# Patient Record
Sex: Female | Born: 1990 | Race: White | Hispanic: No | Marital: Single | Smoking: Current every day smoker
Health system: Southern US, Community
[De-identification: ages and names within clinical notes are randomized; demographics above are authoritative.]

## PROBLEM LIST (undated history)

## (undated) DIAGNOSIS — R011 Cardiac murmur, unspecified: Secondary | ICD-10-CM

## (undated) DIAGNOSIS — F419 Anxiety disorder, unspecified: Secondary | ICD-10-CM

## (undated) DIAGNOSIS — K219 Gastro-esophageal reflux disease without esophagitis: Secondary | ICD-10-CM

## (undated) HISTORY — PX: CHOLECYSTECTOMY: SHX55

## (undated) HISTORY — DX: Cardiac murmur, unspecified: R01.1

## (undated) HISTORY — PX: EXTERNAL EAR SURGERY: SHX627

## (undated) HISTORY — DX: Gastro-esophageal reflux disease without esophagitis: K21.9

---
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
## 2010-09-21 NOTE — Op Note (Signed)
[name], [name]          ACCOUNT NO.:  [id number]   MEDICAL RECORD NO.:  [id number]          PATIENT TYPE:  AMB   LOCATION:  DAY                           FACILITY:  [facility]   PHYSICIAN:  Dalia Heading, M.D.  DATE OF BIRTH:  03-01-1991   DATE OF PROCEDURE:  DATE OF DISCHARGE:                               OPERATIVE REPORT   PREOPERATIVE DIAGNOSIS:  Chronic cholecystitis.   POSTOPERATIVE DIAGNOSIS:  Chronic cholecystitis.   PROCEDURE:  Laparoscopic cholecystectomy.   SURGEON:  Dalia Heading, M.D.   ANESTHESIA:  General endotracheal.   INDICATIONS:  The patient is a 20 year old white female who is referred  for evaluation and treatment of biliary colic secondary to chronic  cholecystitis.  The risks and benefits of the procedure including,  bleeding, infection, hepatobiliary injury, the pus and bleeding from an  open procedure were fully explained to the patient, who gave informed  consent.   PROCEDURE NOTE:  The patient was placed in the supine position.  After  induction of general endotracheal anesthesia, the abdomen was prepped  and draped using the usual sterile technique with Betadine.  Surgical  site confirmation was performed.   A supraumbilical incision was made down to the fascia.  A Veress needle  was introduced into the abdominal cavity and confirmation of placement  was done using the saline drop test.  The abdomen was then insufflated  to 16 mmHg pressure.  An 11-mm trocar was introduced into the abdominal  cavity under direct visualization without difficulty.  The patient was  placed in reversed Trendelenburg position.  An additional 11-mm trocar  was placed in the epigastric region and 5-mm trocars were placed in the  right upper quadrant and right flank regions.  Liver was inspected and  noted to be within normal limits.  The gallbladder was retracted  superiorly and laterally.  The dissection was begun around the  infundibulum of the gallbladder.   The cystic duct was first identified.  The juncture to the infundibulum fully identified.  Endoclips were  placed proximally and distally on the cystic duct, and the cystic duct  was divided.  This was likewise done on the cystic artery.  The  gallbladder was then freed away from the gallbladder fossa using Bovie  electrocautery.  The gallbladder was delivered through the epigastric  trocar site using Endocatch bag.  The gallbladder was decompressed prior  to its removal.  The gallbladder fossa was inspected.  No abnormal  bleeding or bile leakage was noted.  Surgicel was placed in the  gallbladder fossa.  All fluid and air were then evacuated from the  abdominal cavity prior to removal of the trocars.   All wounds were irrigated with normal saline.  All wounds were injected  with 0.5% Sensorcaine.  The supraumbilical fascia was reapproximated  using an 0 Vicryl interrupted suture.  All skin incisions were closed  using staples.  Betadine ointment and dry sterile dressings were  applied.   All tape and needle counts were correct at the end of the procedure.  The patient was extubated in the operating room and went back to  recovery room awake in stable condition.   COMPLICATIONS:  None.   SPECIMEN:  Gallbladder.   BLOOD LOSS:  Minimal.      Dalia Heading, M.D.  Electronically Signed     MAJ/MEDQ  D:  10/24/2007  T:  10/25/2007  Job:  [id number]   cc:   Corrie Mckusick, M.D.  Fax: [phone]

## 2010-09-21 NOTE — H&P (Signed)
NAMEGEORGIA, [name]          ACCOUNT NO.:  [id number]   MEDICAL RECORD NO.:  [id number]          PATIENT TYPE:  AMB   LOCATION:  DAY                           FACILITY:  [facility]   PHYSICIAN:  Dalia Heading, M.D.  DATE OF BIRTH:  10/11/1990   DATE OF ADMISSION:  DATE OF DISCHARGE:  LH                              HISTORY & PHYSICAL   CHIEF COMPLAINT:  Chronic cholecystitis.   HISTORY OF PRESENT ILLNESS:  The patient is a 20 year old white female  who is referred for evaluation and treatment of biliary cholic secondary  to chronic cholecystitis.  She has been having intermittent right upper  quadrant abdominal pain with radiation to the flank, nausea, and  indigestion for the past 3 weeks.  It is made worse with fatty foods.  No fever, chills, or jaundice have been noted.   PAST MEDICAL HISTORY:  Includes H. pylori infection, extrinsic  allergies.   PAST SURGICAL HISTORY:  Unremarkable.   CURRENT MEDICATIONS:  Prevpac, Nexium, and allergy pill.   ALLERGIES:  No known drug allergies.   REVIEW OF SYSTEMS:  Noncontributory.   PHYSICAL EXAMINATION:  The patient is an obese white female in no acute  distress.  HEENT:  Examination reveals no scleral icterus.  LUNGS:  Clear to auscultation with equal breath sounds bilaterally.  HEART:  Examination reveals a regular rate and rhythm without S3, S4.  No murmurs.  ABDOMEN:  Soft, nontender, nondistended.  No hepatosplenomegaly, masses,  or hernias are identified.   Ultrasound of the gallbladder is negative.  Hepatobiliary scan reveals  chronic cholecystitis with a low gallbladder ejection fraction and  reproducible symptoms with a fatty meal.   IMPRESSION:  Chronic cholecystitis.   PLAN:  The patient is scheduled for a laparoscopic cholecystectomy on  October 24, 2007.  The risks and benefits of the procedure including  bleeding, infection, hepatobiliary injury, and the possibility of an  open procedure were fully explained to  the patient.  Gave informed  consent.      Dalia Heading, M.D.  Electronically Signed     MAJ/MEDQ  D:  10/23/2007  T:  10/23/2007  Job:  [id number]   cc:   Corrie Mckusick, M.D.  Fax: [phone]

## 2010-11-26 ENCOUNTER — Other Ambulatory Visit (HOSPITAL_COMMUNITY): Payer: Self-pay | Admitting: Family Medicine

## 2010-11-26 ENCOUNTER — Ambulatory Visit (HOSPITAL_COMMUNITY)
Admission: RE | Admit: 2010-11-26 | Discharge: 2010-11-26 | Disposition: A | Discharge status: Home / Self Care | Payer: Medicaid Other | Source: Ambulatory Visit | Attending: Family Medicine | Admitting: Family Medicine

## 2010-11-26 DIAGNOSIS — M25579 Pain in unspecified ankle and joints of unspecified foot: Secondary | ICD-10-CM

## 2011-01-11 ENCOUNTER — Encounter: Payer: Self-pay | Admitting: Gastroenterology

## 2011-01-11 ENCOUNTER — Ambulatory Visit (INDEPENDENT_AMBULATORY_CARE_PROVIDER_SITE_OTHER): Payer: Medicaid Other | Admitting: Gastroenterology

## 2011-01-11 VITALS — BP 120/77 | HR 74 | Temp 97.6°F | Ht 71.0 in | Wt 305.2 lb

## 2011-01-11 DIAGNOSIS — K625 Hemorrhage of anus and rectum: Secondary | ICD-10-CM | POA: Insufficient documentation

## 2011-01-11 MED ORDER — HYDROCORTISONE ACETATE 25 MG RE SUPP: 25.0000 mg | Freq: Two times a day (BID) | RECTAL | Status: AC

## 2011-01-11 NOTE — Progress Notes (Signed)
Primary Care Physician:  Colette Ribas, MD Primary Gastroenterologist:  Dr. Jena Gauss  Chief Complaint  Patient presents with  . Rectal Bleeding    HPI:   Ms. [name] is a pleasant 20 year old Caucasian female who presents as a consultation for rectal bleeding. She was seen by Terie Purser, PA. She reports a 2 month history of intermittent large amount of brbpr. Painful when defecating, described "like a knife". Has not noticed rectal bleeding for one week now. Baseline bowel habits are twice per day, no diarrhea. Denies any itching or burning.  Also reports mid-abdominal/lower abdominal cramping described as constant. Slight relief after BM. 6/10. Denies N/V. Denies fever or chills. No loss of appetite. Uses Aleve sparingly for headaches.   Taking fiber intermittently.   Past Medical History  Diagnosis Date  . Heart murmur     Past Surgical History  Procedure Date  . Cholecystectomy   . External ear surgery     right    Current Outpatient Prescriptions  Medication Sig Dispense Refill  . hydrocortisone (ANUSOL-HC) 25 MG suppository Place 1 suppository (25 mg total) rectally every 12 (twelve) hours. For 7 days.  14 suppository  0    Allergies as of 01/11/2011  . (No Known Allergies)    Family History  Problem Relation Age of Onset  . Colon cancer Maternal Grandfather     died at age 42 from complications with pancreatitis  . Crohn's disease Maternal Grandmother     History   Social History  . Marital Status: Single    Spouse Name: N/A    Number of Children: N/A  . Years of Education: N/A   Occupational History  . [employer]     starting school in the Spring    Social History Main Topics  . Smoking status: Current Everyday Smoker -- 0.2 packs/day    Types: Cigarettes  . Smokeless tobacco: Not on file   Comment: 2009  . Alcohol Use: No  . Drug Use: No  . Sexually Active: Not on file    Review of Systems: Gen: Denies any fever, chills, fatigue,  weight loss, lack of appetite.  CV: Denies chest pain, heart palpitations, peripheral edema, syncope.  Resp: Denies shortness of breath at rest or with exertion. Denies wheezing or cough.  GI: Denies dysphagia or odynophagia. Denies jaundice, hematemesis, fecal incontinence. GU : Denies urinary burning, urinary frequency, urinary hesitancy MS: Denies joint pain, muscle weakness, cramps, or limitation of movement.  Derm: Denies rash, itching, dry skin Psych: Denies depression, anxiety, memory loss, and confusion Heme: Denies bruising, bleeding, and enlarged lymph nodes.  Physical Exam: BP 120/77  Pulse 74  Temp(Src) 97.6 F (36.4 C) (Temporal)  Ht 5\' 11"  (1.803 m)  Wt 305 lb 3.2 oz (138.438 kg)  BMI 42.57 kg/m2 General:   Alert and oriented. Pleasant and cooperative. Well-nourished and well-developed. Obese.  Head:  Normocephalic and atraumatic. Eyes:  Without icterus, sclera clear and conjunctiva pink.  Ears:  Normal auditory acuity. Nose:  No deformity, discharge,  or lesions. Mouth:  No deformity or lesions, oral mucosa pink.  Neck:  Supple, without mass or thyromegaly. Lungs:  Clear to auscultation bilaterally. No wheezes, rales, or rhonchi. No distress.  Heart:  S1, S2 present  Abdomen:  Obese, large AP diameter, +BS, soft, non-tender and non-distended. No HSM noted. No guarding or rebound. No masses appreciated.  Rectal:  Deferred  Msk:  Symmetrical without gross deformities. Normal posture. Extremities:  Without clubbing or edema. Neurologic:  Alert  and  oriented x4;  grossly normal neurologically. Skin:  Intact without significant lesions or rashes. Cervical Nodes:  No significant cervical adenopathy. Psych:  Alert and cooperative. Normal mood and affect.

## 2011-01-11 NOTE — Patient Instructions (Signed)
We have set you up for a colonoscopy with Dr. Jena Gauss.  Continue a high fiber diet.   You may use the suppositories for 7 days. Further recommendations to follow after the procedure is completed.

## 2011-01-11 NOTE — Assessment & Plan Note (Signed)
20 year old Caucasian female with 2 month history of moderate amount of brbpr. No evidence of rectal bleeding X 1 week per her report. Rectal discomfort described as "sharp". Reports discomfort with BM. No diarrhea, [name], [name], lack of appetite, [name], maternal grandmother). Likely rectal bleeding from benign source, [name], [name], [name]  Proceed with TCS with Dr. Jena Gauss in near future: the risks, benefits, and alternatives have been discussed with the patient in detail. The patient states understanding and desires to proceed. High fiber diet 7 day course of anusol suppositories

## 2011-01-12 NOTE — Progress Notes (Signed)
Cc to PCP 

## 2011-01-20 ENCOUNTER — Telehealth: Payer: Self-pay | Admitting: Gastroenterology

## 2011-01-20 NOTE — Telephone Encounter (Signed)
pts procedure time moved up- pts mother is aware

## 2011-01-21 ENCOUNTER — Encounter (HOSPITAL_COMMUNITY)
Admission: RE | Disposition: A | Discharge status: Home / Self Care | Payer: Self-pay | Source: Ambulatory Visit | Attending: Internal Medicine

## 2011-01-21 ENCOUNTER — Encounter (HOSPITAL_COMMUNITY): Payer: Self-pay | Admitting: *Deleted

## 2011-01-21 ENCOUNTER — Ambulatory Visit (HOSPITAL_COMMUNITY)
Admission: RE | Admit: 2011-01-21 | Discharge: 2011-01-21 | Disposition: A | Discharge status: Home / Self Care | Payer: Medicaid Other | Source: Ambulatory Visit | Attending: Internal Medicine | Admitting: Internal Medicine

## 2011-01-21 DIAGNOSIS — K648 Other hemorrhoids: Secondary | ICD-10-CM | POA: Insufficient documentation

## 2011-01-21 DIAGNOSIS — K5909 Other constipation: Secondary | ICD-10-CM | POA: Insufficient documentation

## 2011-01-21 DIAGNOSIS — K921 Melena: Secondary | ICD-10-CM | POA: Insufficient documentation

## 2011-01-21 DIAGNOSIS — K625 Hemorrhage of anus and rectum: Secondary | ICD-10-CM

## 2011-01-21 HISTORY — PX: COLONOSCOPY: SHX5424

## 2011-01-21 SURGERY — COLONOSCOPY
Anesthesia: Moderate Sedation

## 2011-01-21 MED ORDER — MEPERIDINE HCL 100 MG/ML IJ SOLN
INTRAMUSCULAR | Status: DC | PRN
  Administered 2011-01-21 (×3): 50 mg via INTRAVENOUS

## 2011-01-21 MED ORDER — SODIUM CHLORIDE 0.45 % IV SOLN
Freq: Once | INTRAVENOUS | Status: AC
  Administered 2011-01-21: 20 mL/h via INTRAVENOUS

## 2011-01-21 MED ORDER — MIDAZOLAM HCL 5 MG/5ML IJ SOLN
INTRAMUSCULAR | Status: DC | PRN
  Administered 2011-01-21 (×3): 2 mg via INTRAVENOUS

## 2011-01-21 MED ORDER — MIDAZOLAM HCL 5 MG/5ML IJ SOLN
INTRAMUSCULAR | Status: AC
  Filled 2011-01-21: qty 10

## 2011-01-21 MED ORDER — SODIUM CHLORIDE 0.45 % IV SOLN: Freq: Once | INTRAVENOUS | Status: DC

## 2011-01-21 MED ORDER — MEPERIDINE HCL 100 MG/ML IJ SOLN
INTRAMUSCULAR | Status: AC
  Filled 2011-01-21: qty 2

## 2011-01-21 NOTE — H&P (Signed)
Victoria Halls, NP  01/11/2011  4:55 PM  Signed     Primary Care Physician:  Colette Ribas, MD Primary Gastroenterologist:  Dr. Jena Gauss    Chief Complaint   Patient presents with   .  Rectal Bleeding      HPI:    [name] is a pleasant 20 year old Caucasian female who presents as a consultation for rectal bleeding. She was seen by Terie Purser, PA. She reports a 2 month history of intermittent large amount of brbpr. Painful when defecating, described "like a knife". Has not noticed rectal bleeding for one week now. Baseline bowel habits are twice per day, no diarrhea. Denies any itching or burning.   Also reports mid-abdominal/lower abdominal cramping described as constant. Slight relief after BM. 6/10. Denies N/V. Denies fever or chills. No loss of appetite. Uses Aleve sparingly for headaches.    Taking fiber intermittently.     Past Medical History   Diagnosis  Date   .  Heart murmur         Past Surgical History   Procedure  Date   .  Cholecystectomy     .  External ear surgery         right       Current Outpatient Prescriptions   Medication  Sig  Dispense  Refill   .  hydrocortisone (ANUSOL-HC) 25 MG suppository  Place 1 suppository (25 mg total) rectally every 12 (twelve) hours. For 7 days.   14 suppository   0       Allergies as of 01/11/2011   .  (No Known Allergies)       Family History   Problem  Relation  Age of Onset   .  Colon cancer  Maternal Grandfather         died at age 68 from complications with pancreatitis   .  Crohn's disease  Maternal Grandmother         History       Social History   .  Marital Status:  Single       Spouse Name:  N/A       Number of Children:  N/A   .  Years of Education:  N/A       Occupational History   .  [employer]         starting school in the Spring        Social History Main Topics   .  Smoking status:  Current Everyday Smoker -- 0.2 packs/day       Types:  Cigarettes   .  Smokeless tobacco:  Not  on file     Comment: 2009   .  Alcohol Use:  No   .  Drug Use:  No   .  Sexually Active:  Not on file        Review of Systems: Gen: Denies any fever, chills, fatigue, weight loss, lack of appetite.   CV: Denies chest pain, heart palpitations, peripheral edema, syncope.   Resp: Denies shortness of breath at rest or with exertion. Denies wheezing or cough.   GI: Denies dysphagia or odynophagia. Denies jaundice, hematemesis, fecal incontinence. GU : Denies urinary burning, urinary frequency, urinary hesitancy MS: Denies joint pain, muscle weakness, cramps, or limitation of movement.   Derm: Denies rash, itching, dry skin Psych: Denies depression, anxiety, memory loss, and confusion Heme: Denies bruising, bleeding, and enlarged lymph nodes.   Physical Exam: BP 120/77  Pulse 74  Temp(Src) 97.6 F (  36.4 C) (Temporal)  Ht 5\' 11"  (1.803 m)  Wt 305 lb 3.2 oz (138.438 kg)  BMI 42.57 kg/m2 General:   Alert and oriented. Pleasant and cooperative. Well-nourished and well-developed. Obese.   Head:  Normocephalic and atraumatic. Eyes:  Without icterus, sclera clear and conjunctiva pink.   Ears:  Normal auditory acuity. Nose:  No deformity, discharge,  or lesions. Mouth:  No deformity or lesions, oral mucosa pink.   Neck:  Supple, without mass or thyromegaly. Lungs:  Clear to auscultation bilaterally. No wheezes, rales, or rhonchi. No distress.   Heart:  S1, S2 present   Abdomen:  Obese, large AP diameter, +BS, soft, non-tender and non-distended. No HSM noted. No guarding or rebound. No masses appreciated.   Rectal:  Deferred   Msk:  Symmetrical without gross deformities. Normal posture. Extremities:  Without clubbing or edema. Neurologic:  Alert and  oriented x4;  grossly normal neurologically. Skin:  Intact without significant lesions or rashes. Cervical Nodes:  No significant cervical adenopathy. Psych:  Alert and cooperative. Normal mood and affect.         Glendora Score   01/12/2011  9:53 AM  Signed Cc to PCP        Rectal bleeding - Victoria Halls, NP  01/11/2011  4:55 PM  Signed 20 year old Caucasian female with 2 month history of moderate amount of brbpr. No evidence of rectal bleeding X 1 week per her report. Rectal discomfort described as "sharp". Reports discomfort with BM. No diarrhea, [name], [name], lack of appetite, [name], maternal grandmother). Likely rectal bleeding from benign source, [name], [name], [name]   Proceed with TCS with Dr. Jena Gauss in near future: the risks, benefits, and alternatives have been discussed with the patient in detail. The patient states understanding and desires to proceed. High fiber diet 7 day course of anusol suppositories   I have seen the patient prior to the procedure(s) today and reviewed the history and physical / consultation from 01/11/11.  There have been no changes. After consideration of the risks, benefits, alternatives and imponderables, the patient has consented to the procedure(s).

## 2011-01-24 DIAGNOSIS — K648 Other hemorrhoids: Secondary | ICD-10-CM

## 2011-01-24 DIAGNOSIS — K5900 Constipation, unspecified: Secondary | ICD-10-CM

## 2011-01-24 DIAGNOSIS — K921 Melena: Secondary | ICD-10-CM

## 2011-01-27 ENCOUNTER — Encounter (HOSPITAL_COMMUNITY): Payer: Self-pay | Admitting: Internal Medicine

## 2011-02-03 LAB — HCG, QUANTITATIVE, PREGNANCY: hCG, Beta Chain, Quant, S: <2

## 2011-02-17 LAB — URINALYSIS, ROUTINE W REFLEX MICROSCOPIC
Bilirubin Urine: NEGATIVE
Bilirubin Urine: NEGATIVE
Glucose, UA: NEGATIVE
Hgb urine dipstick: NEGATIVE
Ketones, ur: NEGATIVE
Nitrite: NEGATIVE
Specific Gravity, Urine: 1.025
Specific Gravity, Urine: >1.03 — ABNORMAL HIGH
Urobilinogen, UA: 0.2

## 2012-08-06 ENCOUNTER — Telehealth: Payer: Self-pay | Admitting: Internal Medicine

## 2012-08-06 MED ORDER — HYDROCORTISONE 2.5 % RE CREA: TOPICAL_CREAM | Freq: Two times a day (BID) | RECTAL | Status: DC

## 2012-08-06 NOTE — Telephone Encounter (Signed)
Pt's mother called to speak with nurse about problems her daughter is having. She said pt had tcs about 2 years ago and was told she had hemorrhoids. Pt was recently seen at [facility] ER because she has blood in her stools. Pt has no insurance and can't afford to see specialist and just wanted to know what RMR or AS would advise her to do without OV. Mother thinks the patient may have hemorrhoids again. Please advise [phone]

## 2012-08-06 NOTE — Telephone Encounter (Signed)
Routing to AS for review. Pt was last seen 01/11/11 and apparently cannot afford to come in for ov now.

## 2012-08-06 NOTE — Telephone Encounter (Signed)
She can fill out paper work for [employer].  In the meantime, avoid straining, constipation. Follow a high fiber diet.  I have sent in one course of anusol cream, which will be cheaper than the suppositories. Needs OV if no improvement.

## 2012-08-06 NOTE — Telephone Encounter (Signed)
pts mother is aware

## 2016-05-17 ENCOUNTER — Other Ambulatory Visit (HOSPITAL_COMMUNITY)
Admission: RE | Admit: 2016-05-17 | Discharge: 2016-05-17 | Disposition: A | Discharge status: Home / Self Care | Payer: Medicaid Other | Source: Ambulatory Visit | Attending: Unknown Physician Specialty | Admitting: Unknown Physician Specialty

## 2016-05-17 DIAGNOSIS — N870 Mild cervical dysplasia: Secondary | ICD-10-CM | POA: Insufficient documentation

## 2016-11-02 ENCOUNTER — Encounter: Payer: Self-pay | Admitting: Obstetrics and Gynecology

## 2016-11-11 ENCOUNTER — Encounter: Payer: Self-pay | Admitting: Obstetrics and Gynecology

## 2019-12-18 ENCOUNTER — Ambulatory Visit (INDEPENDENT_AMBULATORY_CARE_PROVIDER_SITE_OTHER): Payer: Self-pay | Admitting: Obstetrics and Gynecology

## 2019-12-18 ENCOUNTER — Encounter: Payer: Self-pay | Admitting: Obstetrics and Gynecology

## 2019-12-18 VITALS — BP 156/100 | HR 94 | Ht 60.0 in | Wt 325.0 lb

## 2019-12-18 DIAGNOSIS — N946 Dysmenorrhea, unspecified: Secondary | ICD-10-CM

## 2019-12-18 DIAGNOSIS — R1032 Left lower quadrant pain: Secondary | ICD-10-CM

## 2019-12-18 NOTE — Progress Notes (Signed)
PATIENT ID: [name], female     DOB: March 30, 1991, 29 y.o.     MRN: [id number]   [facility] Visit  12/18/19     PATIENT NAME: [name]     MRN [id number]     DOB: 04/28/1991  CC & HPI:   Chief Complaint  Patient presents with  . Abdominal Pain   [name] is a 29 y.o. female presenting today for abdominal pain.  She notes that she has had several pap smears by the health department that have returned abnormal over the last several years, but that she hasn't received any direction or clear answers in regard to what that might mean for her. We do not have access to these medical records at the time of the visit. She would like this issue addressed at a separate visit when those records are made available to [place].  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   Her more pressing issue at this time is lower abdominal pain. She notes that she is hurting all the time. She is taking midol and tylenol to treat with only mild relief. She is hurting today, and she notes that her pain is lasting about three days. She is currently menstruating in day 4. Generally though, her pain lasts most of the time even off her cycle.    ROS:  Review of Systems  Constitutional: Negative.   HENT: Negative.   Eyes: Negative.   Respiratory: Negative.   Cardiovascular: Negative.   Gastrointestinal: Positive for abdominal pain.  Endocrine: Negative.   Genitourinary: Negative.   Musculoskeletal: Negative.   Skin: Negative.   Allergic/Immunologic: Negative.   Neurological: Negative.   Hematological: Negative.   Psychiatric/Behavioral: Negative.   All other systems reviewed and are negative.    Pertinent History Reviewed:  Medical         Past Medical History:  Diagnosis Date  . Heart murmur                               Surgical Hx:    Medications: Reviewed &  Updated - see associated section                       Current Outpatient Medications:  .  hydrocortisone (ANUSOL-HC) 2.5 % rectal cream, Place rectally 2 (two) times daily. For 7 days, Disp: 30 g, Rfl: 1   Social History: Reviewed -  reports that she has been smoking cigarettes. She has been smoking about 0.25 packs per day. She does not have any smokeless tobacco history on file.  Objective Findings:  Vitals: Blood pressure (!) 156/100, pulse 94, height 5' (1.524 m), weight (!) 325 lb (147.4 kg).  PHYSICAL EXAMINATION General appearance - alert, well appearing, and in no distress, oriented to person, place, and time and overweight Mental status - alert, oriented to person, place, and time, affect appropriate to mood, anxious Chest - not examined Heart - not examined Abdomen - tenderness noted LLQ  Breasts - not examined Skin - normal coloration and turgor, no rashes, no suspicious skin lesions noted  PELVIC External genitalia - normal Vulva - normal Vagina - normal Cervix - on menses.,  nulliparous Uterus - mildly tender, currently menstruating   Adnexa - neg [place] - n/a Rectal - rectal exam not indicated  Assessment & Plan:   A:  1.  Musculoskeletal discomfort LLQ abd wall 2. dysmenorrheal  P:  1.  abd belt 2. NSAIDS prn 3. Consider re-exam in 6 wk when not on menses. 4.    By signing my name below, I, [employer], attest that this documentation has been prepared under the direction and in the presence of Tilda Burrow, MD. Electronically Signed: Mal Misty Medical Scribe. 12/18/19. 2:39 PM.  I personally performed the services described in this documentation, which was SCRIBED in my presence. The recorded information has been reviewed and considered accurate. It has been edited as necessary during review. Tilda Burrow, MD

## 2020-08-03 ENCOUNTER — Ambulatory Visit (INDEPENDENT_AMBULATORY_CARE_PROVIDER_SITE_OTHER): Payer: Self-pay | Admitting: Adult Health

## 2020-08-03 ENCOUNTER — Other Ambulatory Visit (HOSPITAL_COMMUNITY)
Admission: RE | Admit: 2020-08-03 | Discharge: 2020-08-03 | Disposition: A | Discharge status: Home / Self Care | Payer: Self-pay | Source: Ambulatory Visit | Attending: Adult Health | Admitting: Adult Health

## 2020-08-03 ENCOUNTER — Encounter: Payer: Self-pay | Admitting: Adult Health

## 2020-08-03 ENCOUNTER — Other Ambulatory Visit: Payer: Self-pay

## 2020-08-03 VITALS — BP 107/69 | HR 66 | Ht 72.0 in | Wt 309.0 lb

## 2020-08-03 DIAGNOSIS — N898 Other specified noninflammatory disorders of vagina: Secondary | ICD-10-CM | POA: Insufficient documentation

## 2020-08-03 DIAGNOSIS — N926 Irregular menstruation, unspecified: Secondary | ICD-10-CM

## 2020-08-03 DIAGNOSIS — Z8742 Personal history of other diseases of the female genital tract: Secondary | ICD-10-CM

## 2020-08-03 DIAGNOSIS — R102 Pelvic and perineal pain: Secondary | ICD-10-CM | POA: Insufficient documentation

## 2020-08-03 DIAGNOSIS — N946 Dysmenorrhea, unspecified: Secondary | ICD-10-CM

## 2020-08-03 DIAGNOSIS — N941 Unspecified dyspareunia: Secondary | ICD-10-CM

## 2020-08-03 DIAGNOSIS — Z3202 Encounter for pregnancy test, result negative: Secondary | ICD-10-CM

## 2020-08-03 LAB — POCT URINE PREGNANCY: Preg Test, Ur: NEGATIVE

## 2020-08-03 NOTE — Progress Notes (Signed)
  Subjective:     Patient ID: [name], female   DOB: May 16, 1990, 30 y.o.   MRN: [id number]  HPI [name] is a 30 year old white female, single, G0P0, in complaining of vaginal discharge and pelvic pain. Has had pelvic pain for over a year, was seen in [place] in ER in October and treated for UTI and had US showed bilateral ovarian cysts, one 6 cm. She did not get follow up US as recommended.  She is self pay. PCP is Dr Victoria Odor.  Review of Systems +pelvic pain for over a year +vaginal discharge with odor +missed periods, last depo October +pain with sex +pain when has period Reviewed past medical,surgical, social and family history. Reviewed medications and allergies.      Objective:   Physical Exam BP 107/69 (BP Location: Left Arm, Patient Position: Sitting, Cuff Size: Large)   Pulse 66   Ht 6' (1.829 m)   Wt (!) 309 lb (140.2 kg)   BMI 41.91 kg/m UPT is negative. Skin warm and dry.Pelvic: external genitalia is normal in appearance no lesions, vagina: white discharge without odor,urethra has no lesions or masses noted, cervix:smooth, uterus: normal size, shape and contour, non tender, no masses felt, adnexa: no masses or tenderness noted. Bladder is non tender and no masses felt. Examination chaperoned by Malachy Mood LPN.   CV swab sent Fall risk is moderate  [facility] - 08/03/20 0903      Pregnancy Intention Screening   Does the patient want to become pregnant in the next year? Ok Either Way    Does the patient's partner want to become pregnant in the next year? Ok Either Way    Would the patient like to discuss contraceptive options today? No      Contraception Wrap Up   Current Method No Method - Other Reason    End Method Female Condom    Contraception Counseling Provided No             Assessment:     1. Pregnancy examination or test, negative result  2. Pelvic pain Will get GYN [place] at [facility] 08/10/20 at 9:30 am to assess ovaries   3. History of  ovarian cyst GYN [place] 08/10/20 at [facility]  4. Vaginal discharge CV swab sent for GC/CHL,trich,BV and yeast  5. Missed periods Last depo in October  6. Dysmenorrhea  7. Dyspareunia, female    Plan:     Will talk when results back Apply for [facility]

## 2020-08-04 LAB — CERVICOVAGINAL ANCILLARY ONLY
Bacterial Vaginitis (gardnerella): POSITIVE — AB
Candida Glabrata: NEGATIVE
Candida Vaginitis: NEGATIVE
Chlamydia: NEGATIVE
Comment: NEGATIVE
Comment: NEGATIVE
Comment: NEGATIVE
Comment: NEGATIVE
Comment: NEGATIVE
Comment: NORMAL
Neisseria Gonorrhea: NEGATIVE
Trichomonas: NEGATIVE

## 2020-08-05 ENCOUNTER — Other Ambulatory Visit: Payer: Self-pay | Admitting: Adult Health

## 2020-08-05 MED ORDER — METRONIDAZOLE 500 MG PO TABS: 500.0000 mg | ORAL_TABLET | Freq: Two times a day (BID) | ORAL | 0 refills | Status: DC

## 2020-08-05 NOTE — Progress Notes (Signed)
+  BV on vaginal swab will rx flagyl 

## 2020-08-10 ENCOUNTER — Ambulatory Visit (HOSPITAL_COMMUNITY)
Admission: RE | Admit: 2020-08-10 | Discharge: 2020-08-10 | Disposition: A | Discharge status: Home / Self Care | Payer: Self-pay | Source: Ambulatory Visit | Attending: Adult Health | Admitting: Adult Health

## 2020-08-10 ENCOUNTER — Other Ambulatory Visit: Payer: Self-pay

## 2020-08-10 DIAGNOSIS — Z8742 Personal history of other diseases of the female genital tract: Secondary | ICD-10-CM | POA: Insufficient documentation

## 2020-08-10 DIAGNOSIS — R102 Pelvic and perineal pain: Secondary | ICD-10-CM | POA: Insufficient documentation

## 2020-08-11 ENCOUNTER — Telehealth: Payer: Self-pay | Admitting: Adult Health

## 2020-08-11 ENCOUNTER — Encounter: Payer: Self-pay | Admitting: Adult Health

## 2020-08-11 DIAGNOSIS — N801 Endometriosis of ovary: Secondary | ICD-10-CM | POA: Insufficient documentation

## 2020-08-11 DIAGNOSIS — N80129 Deep endometriosis of ovary, unspecified ovary: Secondary | ICD-10-CM

## 2020-08-11 HISTORY — DX: Deep endometriosis of ovary, unspecified ovary: N80.129

## 2020-08-11 HISTORY — DX: Endometriosis of ovary: N80.1

## 2020-08-11 NOTE — Telephone Encounter (Signed)
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

## 2020-08-31 ENCOUNTER — Other Ambulatory Visit: Payer: Self-pay

## 2020-08-31 ENCOUNTER — Ambulatory Visit (INDEPENDENT_AMBULATORY_CARE_PROVIDER_SITE_OTHER): Payer: Self-pay | Admitting: Obstetrics & Gynecology

## 2020-08-31 ENCOUNTER — Encounter: Payer: Self-pay | Admitting: Obstetrics & Gynecology

## 2020-08-31 VITALS — BP 128/80 | HR 71 | Ht 72.0 in | Wt 307.0 lb

## 2020-08-31 DIAGNOSIS — N80129 Deep endometriosis of ovary, unspecified ovary: Secondary | ICD-10-CM

## 2020-08-31 DIAGNOSIS — R102 Pelvic and perineal pain: Secondary | ICD-10-CM

## 2020-08-31 DIAGNOSIS — N801 Endometriosis of ovary: Secondary | ICD-10-CM

## 2020-08-31 MED ORDER — MEGESTROL ACETATE 40 MG PO TABS: ORAL_TABLET | ORAL | 11 refills | Status: DC

## 2020-08-31 MED ORDER — METRONIDAZOLE 0.75 % VA GEL: VAGINAL | 0 refills | Status: DC

## 2020-08-31 NOTE — Progress Notes (Signed)
Follow up appointment for results:  Chief Complaint  Patient presents with  . Endometrioma    Blood pressure 128/80, pulse 71, height 6' (1.829 m), weight (!) 307 lb (139.3 kg), last menstrual period 08/26/2020.  US PELVIC COMPLETE WITH TRANSVAGINAL  Result Date: 08/10/2020 CLINICAL DATA:  Pelvic pain, history of BILATERAL ovarian cysts; no menses since beginning Depo shots EXAM: TRANSABDOMINAL AND TRANSVAGINAL ULTRASOUND OF PELVIS TECHNIQUE: Both transabdominal and transvaginal ultrasound examinations of the pelvis were performed. Transabdominal technique was performed for global imaging of the pelvis including uterus, ovaries, adnexal regions, and pelvic cul-de-sac. It was necessary to proceed with endovaginal exam following the transabdominal exam to visualize the uterus, endometrium, and ovaries. COMPARISON:  02/20/2020 FINDINGS: Uterus Measurements: 6.7 x 3.1 x 4.4 cm = volume: 49 mL. Anteverted. Normal morphology without mass Endometrium Thickness: 6 mm.  No endometrial fluid or focal abnormality Right ovary Measurements: 3.0 x 2.3 x 4.0 cm = volume: 14.3 mL. Small hemorrhagic corpus luteum; no follow-up imaging recommended. Otherwise normal appearance without additional mass. Left ovary Measurements: 9.6 x 4.7 x 6.4 cm = volume: 151.4 mL. Complicated cyst LEFT ovary 5.5 x 5.6 x 3.9 cm containing low level diffuse internal echogenicity favor endometrioma. An additional small LEFT ovarian mass is identified, hypoechoic with low-level homogeneous internal echogenicity 3.2 x 3.0 x 2.5 cm favor additional endometrioma. Additional small cyst adjacent to LEFT ovary likely exophytic arising from LEFT ovary with simple features, measuring 1.4 x 1.1 x 2.1 cm; no follow-up imaging recommended. Other findings Small amount of free pelvic fluid. Cystic structure adjacent to RIGHT ovary with measuring 4.6 x 3.5 x 2.5 cm, simple features most consistent with paraovarian cyst; No follow up imaging recommended. Note:  This recommendation does not apply to premenarchal patients or to those with increased risk (genetic, family history, elevated tumor markers or other high-risk factors) of ovarian cancer. Reference: Radiology 2019 Nov; 293(2):359-371. No additional masses. IMPRESSION: Unremarkable uterus, endometrial complex and RIGHT ovary. Two complex masses within the LEFT ovary measuring 5.5 cm and 2.5 cm in greatest sizes, likely representing endometriomas, both minimally smaller; if not resected, annual follow-up ultrasound assessment recommended. Paraovarian cyst in RIGHT adnexa 4.6 cm diameter; no follow-up imaging recommended, as above. Electronically Signed   By: Ulyses Southward M.D.   On: 08/10/2020 11:27      MEDS ordered this encounter: Meds ordered this encounter  Medications  . megestrol (MEGACE) 40 MG tablet    Sig: 1 tablet daily    Dispense:  30 tablet    Refill:  11  . metroNIDAZOLE (METROGEL VAGINAL) 0.75 % vaginal gel    Sig: Nightly x 5 nights    Dispense:  70 g    Refill:  0    Orders for this encounter: No orders of the defined types were placed in this encounter.   Impression: 1. Endometrioma of ovary, left, 5.5 cm   2. Pelvic pain    Plan: Begin progesterone only management for endometrioma and will do interval evaluation of  Her response in 3 months, would like to avoid depo, so will go with daily oral megestrol  Follow Up: Return in about 3 months (around 11/30/2020) for Follow up, with Dr Despina Hidden.       All questions were answered.  Past Medical History:  Diagnosis Date  . Endometrioma of ovary 08/11/2020  . Heart murmur     Past Surgical History:  Procedure Laterality Date  . CHOLECYSTECTOMY    . COLONOSCOPY  01/21/2011  Procedure: COLONOSCOPY;  Surgeon: Corbin Ade, MD;  Location: [facility];  Service: Endoscopy;  Laterality: N/A;  11:30  . EXTERNAL EAR SURGERY     right    OB History    Gravida  0   Para  0   Term  0   Preterm  0   AB  0    Living  0     SAB  0   IAB  0   Ectopic  0   Multiple  0   Live Births  0           Allergies  Allergen Reactions  . Sulfamethoxazole-Trimethoprim Other (See Comments)    Bactrim-Blisters on mouth    Social History   Socioeconomic History  . Marital status: Single    Spouse name: Not on file  . Number of children: Not on file  . Years of education: Not on file  . Highest education level: Not on file  Occupational History  . Occupation: Visual merchandiser: [employer]    Comment: starting school in the Spring   Tobacco Use  . Smoking status: Current Every Day Smoker    Packs/day: 0.25    Types: Cigarettes  . Smokeless tobacco: Never Used  . Tobacco comment: 2009  Vaping Use  . Vaping Use: Never used  Substance and Sexual Activity  . Alcohol use: No  . Drug use: No  . Sexual activity: Yes    Birth control/protection: None, Condom  Other Topics Concern  . Not on file  Social History Narrative  . Not on file   Social Determinants of Health   Financial Resource Strain: Low Risk   . Difficulty of Paying Living Expenses: Not hard at all  Food Insecurity: No Food Insecurity  . Worried About Programme researcher, broadcasting/film/video in the Last Year: Never true  . Ran Out of Food in the Last Year: Never true  Transportation Needs: No Transportation Needs  . Lack of Transportation (Medical): No  . Lack of Transportation (Non-Medical): No  Physical Activity: Inactive  . Days of Exercise per Week: 0 days  . Minutes of Exercise per Session: 0 min  Stress: No Stress Concern Present  . Feeling of Stress : Only a little  Social Connections: Unknown  . Frequency of Communication with Friends and Family: Not on file  . Frequency of Social Gatherings with Friends and Family: Twice a week  . Attends Religious Services: 1 to 4 times per year  . Active Member of Clubs or Organizations: No  . Attends Banker Meetings: Never  . Marital Status: Living with partner     Family History  Problem Relation Age of Onset  . Colon cancer Maternal Grandfather        died at age 23 from complications with pancreatitis  . Crohn's disease Maternal Grandmother   . Hypertension Father   . Hepatitis C Father   . Stroke Mother   . Heart failure Mother   . Fibromyalgia Mother

## 2020-09-08 ENCOUNTER — Telehealth: Payer: Self-pay | Admitting: Obstetrics & Gynecology

## 2020-09-08 ENCOUNTER — Encounter: Payer: Self-pay | Admitting: Obstetrics & Gynecology

## 2020-11-30 ENCOUNTER — Ambulatory Visit: Payer: Self-pay | Admitting: Obstetrics & Gynecology

## 2020-12-14 ENCOUNTER — Other Ambulatory Visit: Payer: Self-pay

## 2020-12-14 ENCOUNTER — Ambulatory Visit (INDEPENDENT_AMBULATORY_CARE_PROVIDER_SITE_OTHER): Payer: Self-pay | Admitting: Obstetrics & Gynecology

## 2020-12-14 ENCOUNTER — Encounter: Payer: Self-pay | Admitting: Obstetrics & Gynecology

## 2020-12-14 VITALS — BP 132/84 | HR 65 | Ht 72.0 in | Wt 319.0 lb

## 2020-12-14 DIAGNOSIS — N801 Endometriosis of ovary: Secondary | ICD-10-CM

## 2020-12-14 DIAGNOSIS — N80129 Deep endometriosis of ovary, unspecified ovary: Secondary | ICD-10-CM

## 2020-12-14 MED ORDER — MEGESTROL ACETATE 40 MG PO TABS: ORAL_TABLET | ORAL | 3 refills | Status: DC

## 2020-12-14 NOTE — Progress Notes (Signed)
Chief Complaint  Patient presents with   Follow-up      30 y.o. G0P0000 No LMP recorded. (Menstrual status: Other). The current method of family planning is progesterone only megestrol suppression.  Outpatient Encounter Medications as of 12/14/2020  Medication Sig   Acetaminophen (MIDOL PO) Take by mouth.   acetaminophen (TYLENOL) 325 MG tablet Take 650 mg by mouth every 6 (six) hours as needed.   cetirizine (ZYRTEC) 10 MG chewable tablet Chew 10 mg by mouth daily.   ibuprofen (ADVIL) 200 MG tablet Take 200 mg by mouth every 6 (six) hours as needed.   megestrol (MEGACE) 40 MG tablet 1 tablet daily   [DISCONTINUED] metroNIDAZOLE (FLAGYL) 500 MG tablet Take 1 tablet (500 mg total) by mouth 2 (two) times daily.   [DISCONTINUED] metroNIDAZOLE (METROGEL VAGINAL) 0.75 % vaginal gel Nightly x 5 nights   No facility-administered encounter medications on file as of 12/14/2020.    Subjective Pt feels significantly better on the megestrol, no menses States only really  hurts when she lifts heavy  has some discomfort with intercourse, also drive has diminsiehd with the megestrol Past Medical History:  Diagnosis Date   Endometrioma of ovary 08/11/2020   Heart murmur     Past Surgical History:  Procedure Laterality Date   CHOLECYSTECTOMY     COLONOSCOPY  01/21/2011   Procedure: COLONOSCOPY;  Surgeon: Corbin Ade, MD;  Location: [facility];  Service: Endoscopy;  Laterality: N/A;  11:30   EXTERNAL EAR SURGERY     right    OB History     Gravida  0   Para  0   Term  0   Preterm  0   AB  0   Living  0      SAB  0   IAB  0   Ectopic  0   Multiple  0   Live Births  0           Allergies  Allergen Reactions   Sulfamethoxazole-Trimethoprim Other (See Comments)    Bactrim-Blisters on mouth    Social History   Socioeconomic History   Marital status: Single    Spouse name: Not on file   Number of children: Not on file   Years of education: Not on  file   Highest education level: Not on file  Occupational History   Occupation: [employer]    Employer: [employer]    Comment: starting school in the Spring   Tobacco Use   Smoking status: Every Day    Packs/day: 0.25    Types: Cigarettes   Smokeless tobacco: Never   Tobacco comments:    2009  Vaping Use   Vaping Use: Never used  Substance and Sexual Activity   Alcohol use: No   Drug use: No   Sexual activity: Yes    Birth control/protection: None, Condom  Other Topics Concern   Not on file  Social History Narrative   Not on file   Social Determinants of Health   Financial Resource Strain: Low Risk    Difficulty of Paying Living Expenses: Not hard at all  Food Insecurity: No Food Insecurity   Worried About Programme researcher, broadcasting/film/video in the Last Year: Never true   Ran Out of Food in the Last Year: Never true  Transportation Needs: No Transportation Needs   Lack of Transportation (Medical): No   Lack of Transportation (Non-Medical): No  Physical Activity: Inactive   Days of Exercise per  Week: 0 days   Minutes of Exercise per Session: 0 min  Stress: No Stress Concern Present   Feeling of Stress : Only a little  Social Connections: Unknown   Frequency of Communication with Friends and Family: Not on file   Frequency of Social Gatherings with Friends and Family: Twice a week   Attends Religious Services: 1 to 4 times per year   Active Member of [employer] or Organizations: No   Attends Engineer, structural: Never   Marital Status: Living with partner    Family History  Problem Relation Age of Onset   Colon cancer Maternal Grandfather        died at age 42 from complications with pancreatitis   Crohn's disease Maternal Grandmother    Hypertension Father    Hepatitis C Father    Stroke Mother    Heart failure Mother    Fibromyalgia Mother     Medications:       Current Outpatient Medications:    Acetaminophen (MIDOL PO), Take by mouth., Disp: , Rfl:     acetaminophen (TYLENOL) 325 MG tablet, Take 650 mg by mouth every 6 (six) hours as needed., Disp: , Rfl:    cetirizine (ZYRTEC) 10 MG chewable tablet, Chew 10 mg by mouth daily., Disp: , Rfl:    ibuprofen (ADVIL) 200 MG tablet, Take 200 mg by mouth every 6 (six) hours as needed., Disp: , Rfl:    megestrol (MEGACE) 40 MG tablet, 1 tablet daily, Disp: 30 tablet, Rfl: 11  Objective Blood pressure 132/84, pulse 65, height 6' (1.829 m), weight (!) 319 lb (144.7 kg).  Gen WDWN NAD  Pertinent ROS No burning with urination, frequency or urgency No nausea, vomiting or diarrhea Nor fever chills or other constitutional symptoms   Labs or studies Sonogram report reviewed    Impression Diagnoses this Encounter::   ICD-10-CM   1. Endometrioma of ovary  N80.1       Established relevant diagnosis(es):   Plan/Recommendations: No orders of the defined types were placed in this encounter.   Labs or Scans Ordered: No orders of the defined types were placed in this encounter.   Management:: Continue megestrol therapy switch to Dewayne Hatch if gets insurance  Follow up Return if symptoms worsen or fail to improve.   All questions were answered.

## 2021-03-24 MED ORDER — MYFEMBREE 40-1-0.5 MG PO TABS: 1.0000 | ORAL_TABLET | Freq: Every day | ORAL | 11 refills | Status: DC

## 2021-09-06 ENCOUNTER — Other Ambulatory Visit: Payer: Self-pay | Admitting: Obstetrics and Gynecology

## 2021-09-06 DIAGNOSIS — N83299 Other ovarian cyst, unspecified side: Secondary | ICD-10-CM

## 2021-09-08 ENCOUNTER — Ambulatory Visit
Admission: RE | Admit: 2021-09-08 | Discharge: 2021-09-08 | Disposition: A | Discharge status: Home / Self Care | Payer: BC Managed Care – PPO | Source: Ambulatory Visit | Attending: Obstetrics and Gynecology | Admitting: Obstetrics and Gynecology

## 2021-09-08 DIAGNOSIS — N83299 Other ovarian cyst, unspecified side: Secondary | ICD-10-CM

## 2021-09-08 MED ORDER — GADOBENATE DIMEGLUMINE 529 MG/ML IV SOLN
20.0000 mL | Freq: Once | INTRAVENOUS | Status: AC | PRN
  Administered 2021-09-08: 20 mL via INTRAVENOUS

## 2021-12-14 ENCOUNTER — Encounter: Payer: Self-pay | Admitting: Nurse Practitioner

## 2021-12-14 ENCOUNTER — Ambulatory Visit: Payer: BC Managed Care – PPO | Admitting: Nurse Practitioner

## 2021-12-14 ENCOUNTER — Ambulatory Visit (INDEPENDENT_AMBULATORY_CARE_PROVIDER_SITE_OTHER): Payer: BC Managed Care – PPO

## 2021-12-14 VITALS — BP 119/82 | HR 76 | Temp 98.6°F | Ht 72.0 in | Wt 329.0 lb

## 2021-12-14 DIAGNOSIS — K219 Gastro-esophageal reflux disease without esophagitis: Secondary | ICD-10-CM

## 2021-12-14 DIAGNOSIS — M25551 Pain in right hip: Secondary | ICD-10-CM

## 2021-12-14 MED ORDER — FAMOTIDINE 20 MG PO TABS: 20.0000 mg | ORAL_TABLET | Freq: Two times a day (BID) | ORAL | 2 refills | Status: DC

## 2021-12-14 MED ORDER — METHOCARBAMOL 500 MG PO TABS: 500.0000 mg | ORAL_TABLET | Freq: Four times a day (QID) | ORAL | 0 refills | Status: DC

## 2021-12-14 MED ORDER — IBUPROFEN 600 MG PO TABS: 600.0000 mg | ORAL_TABLET | Freq: Three times a day (TID) | ORAL | 0 refills | Status: DC | PRN

## 2021-12-14 MED ORDER — PREDNISONE 20 MG PO TABS: 20.0000 mg | ORAL_TABLET | Freq: Every day | ORAL | 0 refills | Status: DC

## 2021-12-14 NOTE — Patient Instructions (Signed)
Gastroesophageal Reflux Disease, Adult Gastroesophageal reflux (GER) happens when acid from the stomach flows up into the tube that connects the mouth and the stomach (esophagus). Normally, food travels down the esophagus and stays in the stomach to be digested. However, when a person has GER, food and stomach acid sometimes move back up into the esophagus. If this becomes a more serious problem, the person may be diagnosed with a disease called gastroesophageal reflux disease (GERD). GERD occurs when the reflux: Happens often. Causes frequent or severe symptoms. Causes problems such as damage to the esophagus. When stomach acid comes in contact with the esophagus, the acid may cause inflammation in the esophagus. Over time, GERD may create small holes (ulcers) in the lining of the esophagus. What are the causes? This condition is caused by a problem with the muscle between the esophagus and the stomach (lower esophageal sphincter, or LES). Normally, the LES muscle closes after food passes through the esophagus to the stomach. When the LES is weakened or abnormal, it does not close properly, and that allows food and stomach acid to go back up into the esophagus. The LES can be weakened by certain dietary substances, medicines, and medical conditions, including: Tobacco use. Pregnancy. Having a hiatal hernia. Alcohol use. Certain foods and beverages, such as coffee, chocolate, onions, and peppermint. What increases the risk? You are more likely to develop this condition if you: Have an increased body weight. Have a connective tissue disorder. Take NSAIDs, such as ibuprofen. What are the signs or symptoms? Symptoms of this condition include: Heartburn. Difficult or painful swallowing and the feeling of having a lump in the throat. A bitter taste in the mouth. Bad breath and having a large amount of saliva. Having an upset or bloated stomach and belching. Chest pain. Different conditions can  cause chest pain. Make sure you see your health care provider if you experience chest pain. Shortness of breath or wheezing. Ongoing (chronic) cough or a nighttime cough. Wearing away of tooth enamel. Weight loss. How is this diagnosed? This condition may be diagnosed based on a medical history and a physical exam. To determine if you have mild or severe GERD, your health care provider may also monitor how you respond to treatment. You may also have tests, including: A test to examine your stomach and esophagus with a small camera (endoscopy). A test that measures the acidity level in your esophagus. A test that measures how much pressure is on your esophagus. A barium swallow or modified barium swallow test to show the shape, size, and functioning of your esophagus. How is this treated? Treatment for this condition may vary depending on how severe your symptoms are. Your health care provider may recommend: Changes to your diet. Medicine. Surgery. The goal of treatment is to help relieve your symptoms and to prevent complications. Follow these instructions at home: Eating and drinking  Follow a diet as recommended by your health care provider. This may involve avoiding foods and drinks such as: Coffee and tea, with or without caffeine. Drinks that contain alcohol. Energy drinks and sports drinks. Carbonated drinks or sodas. Chocolate and cocoa. Peppermint and mint flavorings. Garlic and onions. Horseradish. Spicy and acidic foods, including peppers, chili powder, curry powder, vinegar, hot sauces, and barbecue sauce. Citrus fruit juices and citrus fruits, such as oranges, lemons, and limes. Tomato-based foods, such as red sauce, chili, salsa, and pizza with red sauce. Fried and fatty foods, such as donuts, french fries, potato chips, and high-fat dressings.   High-fat meats, such as hot dogs and fatty cuts of red and white meats, such as rib eye steak, sausage, ham, and  bacon. High-fat dairy items, such as whole milk, butter, and cream cheese. Eat small, frequent meals instead of large meals. Avoid drinking large amounts of liquid with your meals. Avoid eating meals during the 2-3 hours before bedtime. Avoid lying down right after you eat. Do not exercise right after you eat. Lifestyle  Do not use any products that contain nicotine or tobacco. These products include cigarettes, chewing tobacco, and vaping devices, such as e-cigarettes. If you need help quitting, ask your health care provider. Try to reduce your stress by using methods such as yoga or meditation. If you need help reducing stress, ask your health care provider. If you are overweight, reduce your weight to an amount that is healthy for you. Ask your health care provider for guidance about a safe weight loss goal. General instructions Pay attention to any changes in your symptoms. Take over-the-counter and prescription medicines only as told by your health care provider. Do not take aspirin, ibuprofen, or other NSAIDs unless your health care provider told you to take these medicines. Wear loose-fitting clothing. Do not wear anything tight around your waist that causes pressure on your abdomen. Raise (elevate) the head of your bed about 6 inches (15 cm). You can use a wedge to do this. Avoid bending over if this makes your symptoms worse. Keep all follow-up visits. This is important. Contact a health care provider if: You have: New symptoms. Unexplained weight loss. Difficulty swallowing or it hurts to swallow. Wheezing or a persistent cough. A hoarse voice. Your symptoms do not improve with treatment. Get help right away if: You have sudden pain in your arms, neck, jaw, teeth, or back. You suddenly feel sweaty, dizzy, or light-headed. You have chest pain or shortness of breath. You vomit and the vomit is green, yellow, or black, or it looks like blood or coffee grounds. You faint. You  have stool that is red, bloody, or black. You cannot swallow, drink, or eat. These symptoms may represent a serious problem that is an emergency. Do not wait to see if the symptoms will go away. Get medical help right away. Call your local emergency services (911 in the [place] Do not drive yourself to the hospital. Summary Gastroesophageal reflux happens when acid from the stomach flows up into the esophagus. GERD is a disease in which the reflux happens often, causes frequent or severe symptoms, or causes problems such as damage to the esophagus. Treatment for this condition may vary depending on how severe your symptoms are. Your health care provider may recommend diet and lifestyle changes, medicine, or surgery. Contact a health care provider if you have new or worsening symptoms. Take over-the-counter and prescription medicines only as told by your health care provider. Do not take aspirin, ibuprofen, or other NSAIDs unless your health care provider told you to do so. Keep all follow-up visits as told by your health care provider. This is important. This information is not intended to replace advice given to you by your health care provider. Make sure you discuss any questions you have with your health care provider. Document Revised: 11/04/2019 Document Reviewed: 11/04/2019 Elsevier Patient Education  2023 [employer] Hip Pain The hip is the joint between the upper legs and the lower pelvis. The bones, cartilage, tendons, and muscles of your hip joint support your body and allow you to move around. Hip  pain can range from a minor ache to severe pain in one or both of your hips. The pain may be felt on the inside of the hip joint near the groin, or on the outside near the buttocks and upper thigh. You may also have swelling or stiffness in your hip area. Follow these instructions at home: Managing pain, stiffness, and swelling     If directed, put ice on the painful area. To do this: Put  ice in a plastic bag. Place a towel between your skin and the bag. Leave the ice on for 20 minutes, 2-3 times a day. If directed, apply heat to the affected area as often as told by your health care provider. Use the heat source that your health care provider recommends, such as a moist heat pack or a heating pad. Place a towel between your skin and the heat source. Leave the heat on for 20-30 minutes. Remove the heat if your skin turns bright red. This is especially important if you are unable to feel pain, heat, or cold. You may have a greater risk of getting burned. Activity Do exercises as told by your health care provider. Avoid activities that cause pain. General instructions  Take over-the-counter and prescription medicines only as told by your health care provider. Keep a journal of your symptoms. Write down: How often you have hip pain. The location of your pain. What the pain feels like. What makes the pain worse. Sleep with a pillow between your legs on your most comfortable side. Keep all follow-up visits as told by your health care provider. This is important. Contact a health care provider if: You cannot put weight on your leg. Your pain or swelling continues or gets worse after one week. It gets harder to walk. You have a fever. Get help right away if: You fall. You have a sudden increase in pain and swelling in your hip. Your hip is red or swollen or very tender to touch. Summary Hip pain can range from a minor ache to severe pain in one or both of your hips. The pain may be felt on the inside of the hip joint near the groin, or on the outside near the buttocks and upper thigh. Avoid activities that cause pain. Write down how often you have hip pain, the location of the pain, what makes it worse, and what it feels like. This information is not intended to replace advice given to you by your health care provider. Make sure you discuss any questions you have with your  health care provider. Document Revised: 09/10/2018 Document Reviewed: 09/10/2018 Elsevier Patient Education  2023 [employer].

## 2021-12-14 NOTE — Progress Notes (Signed)
+  New Patient Note  RE: [name] MRN: [id number] DOB: 03-Oct-1990 Date of Office Visit: 12/14/2021  Chief Complaint: Establish Care, Hip Pain (For about 2 week), and Gastroesophageal Reflux  History of Present Illness: Hip Pain: Patient complains of right hip pain. Onset of the symptoms was several days ago. Inciting event: none. Current symptoms include is aggravated by walking. Associated symptoms: none. Aggravating symptoms: going up and down stairs and walking. Patient's course of pain: intermittent. Patient has had no prior hip problems. Previous visits for this problem: none. Evaluation to date: none.  Treatment to date: none.   GERD: Paitent complains of heartburn. This has been associated with belching, bilious reflux, and heartburn.  She denies choking on food, cough, and deep pressure at base of neck. Symptoms have been present for a few months. She denies dysphagia.  She has not lost weight. She denies melena, hematochezia, hematemesis, and coffee ground emesis. Medical therapy in the past has included  Pepcid .   Assessment and Plan: [name] is a 31 y.o. female she is establisng care today with concerns of right hip and right leg pain. Patient's symptoms are not well controlled, education provided to patient on dietery changes to can aide in improving Heart burn,weight loss , diet and exercise. Completed right hip and foot x-ray results pending, heating pad or ice pack as needed, ibuprofen as needed for pain.   Follow up with worsening unresolved symptoms.  No problem-specific Assessment & Plan notes found for this encounter.  Return if symptoms worsen or fail to improve.   Diagnostics:   Past Medical History: Patient Active Problem List   Diagnosis Date Noted   Endometrioma of ovary 08/11/2020   Missed periods 08/03/2020   Vaginal discharge 08/03/2020   History of ovarian cyst 08/03/2020   Pelvic pain 08/03/2020   Pregnancy examination or test, negative  result 08/03/2020   Dyspareunia, female 08/03/2020   Dysmenorrhea 08/03/2020   Rectal bleeding 01/11/2011   Past Medical History:  Diagnosis Date   Endometrioma of ovary 08/11/2020   Gastroesophageal reflux    Heart murmur    Past Surgical History: Past Surgical History:  Procedure Laterality Date   CHOLECYSTECTOMY     COLONOSCOPY  01/21/2011   Procedure: COLONOSCOPY;  Surgeon: Corbin Ade, MD;  Location: [facility];  Service: Endoscopy;  Laterality: N/A;  11:30   EXTERNAL EAR SURGERY     right   Medication List:  Current Outpatient Medications  Medication Sig Dispense Refill   acetaminophen (TYLENOL) 325 MG tablet Take 650 mg by mouth every 6 (six) hours as needed.     cetirizine (ZYRTEC) 10 MG chewable tablet Chew 10 mg by mouth daily.     famotidine (PEPCID) 20 MG tablet Take 1 tablet (20 mg total) by mouth 2 (two) times daily. 60 tablet 2   ibuprofen (ADVIL) 600 MG tablet Take 1 tablet (600 mg total) by mouth every 8 (eight) hours as needed. 30 tablet 0   methocarbamol (ROBAXIN) 500 MG tablet Take 1 tablet (500 mg total) by mouth 4 (four) times daily. 30 tablet 0   predniSONE (DELTASONE) 20 MG tablet Take 1 tablet (20 mg total) by mouth daily with breakfast. 6 tablet 0   No current facility-administered medications for this visit.   Allergies: Allergies  Allergen Reactions   Sulfamethoxazole-Trimethoprim Other (See Comments)    Bactrim-Blisters on mouth   Social History: Social History   Socioeconomic History   Marital status: Single  Spouse name: Not on file   Number of children: Not on file   Years of education: Not on file   Highest education level: Not on file  Occupational History   Occupation: [employer]    Employer: [employer]    Comment: starting school in the Spring   Tobacco Use   Smoking status: Every Day    Packs/day: 0.25    Types: Cigarettes   Smokeless tobacco: Never   Tobacco comments:    2009  Vaping Use   Vaping Use: Never used   Substance and Sexual Activity   Alcohol use: No   Drug use: No   Sexual activity: Yes    Birth control/protection: None  Other Topics Concern   Not on file  Social History Narrative   Not on file   Social Determinants of Health   Financial Resource Strain: Low Risk  (12/18/2019)   Overall Financial Resource Strain (CARDIA)    Difficulty of Paying Living Expenses: Not hard at all  Food Insecurity: No Food Insecurity (12/18/2019)   Hunger Vital Sign    Worried About Running Out of Food in the Last Year: Never true    Ran Out of Food in the Last Year: Never true  Transportation Needs: No Transportation Needs (12/18/2019)   PRAPARE - Administrator, Civil Service (Medical): No    Lack of Transportation (Non-Medical): No  Physical Activity: Inactive (12/18/2019)   Exercise Vital Sign    Days of Exercise per Week: 0 days    Minutes of Exercise per Session: 0 min  Stress: No Stress Concern Present (12/18/2019)   [employer] of Occupational Health - Occupational Stress Questionnaire    Feeling of Stress : Only a little  Social Connections: Unknown (12/18/2019)   Social Connection and Isolation Panel [NHANES]    Frequency of Communication with Friends and Family: Not on file    Frequency of Social Gatherings with Friends and Family: Twice a week    Attends Religious Services: 1 to 4 times per year    Active Member of [employer] or Organizations: No    Attends Engineer, structural: Never    Marital Status: Living with partner       Family History: Family History  Problem Relation Age of Onset   Stroke Mother    Heart failure Mother    Fibromyalgia Mother    Hypertension Father    Hepatitis C Father    Hypertension Brother    Cancer Paternal Uncle    Crohn's disease Maternal Grandmother    Colon cancer Maternal Grandfather        died at age 12 from complications with pancreatitis   Cancer Paternal Grandmother          Review of Systems   Constitutional: Negative.   HENT: Negative.    Eyes: Negative.   Respiratory: Negative.    Genitourinary: Negative.   Musculoskeletal:  Positive for arthralgias.  All other systems reviewed and are negative.  Objective: BP 119/82   Pulse 76   Temp 98.6 F (37 C)   Ht 6' (1.829 m)   Wt (!) 329 lb (149.2 kg)   LMP 12/04/2021 Comment: has irregular cycles  SpO2 96%   BMI 44.62 kg/m  Body mass index is 44.62 kg/m. Physical Exam Vitals and nursing note reviewed.  Constitutional:      Appearance: Normal appearance.  HENT:     Head: Normocephalic.     Right Ear: External ear normal.  Nose: Nose normal.  Cardiovascular:     Rate and Rhythm: Normal rate.     Pulses: Normal pulses.     Heart sounds: Normal heart sounds.  Pulmonary:     Effort: Pulmonary effort is normal.     Breath sounds: Normal breath sounds.  Abdominal:     General: Bowel sounds are normal.  Musculoskeletal:     Right hip: Tenderness present. Decreased range of motion.     Comments: Right hip/right foot pain  Skin:    General: Skin is warm.     Findings: No erythema or rash.  Neurological:     Mental Status: She is alert and oriented to person, place, and time.    The plan was reviewed with the patient/family, and all questions/concerned were addressed.  It was my pleasure to see [name] today and participate in her care. Please feel free to contact me with any questions or concerns.  Sincerely,  Lynnell Chad NP [name]

## 2021-12-28 ENCOUNTER — Other Ambulatory Visit: Payer: Self-pay | Admitting: Obstetrics and Gynecology

## 2022-02-01 ENCOUNTER — Ambulatory Visit (INDEPENDENT_AMBULATORY_CARE_PROVIDER_SITE_OTHER): Payer: BC Managed Care – PPO | Admitting: Family Medicine

## 2022-02-01 DIAGNOSIS — Z91199 Patient's noncompliance with other medical treatment and regimen due to unspecified reason: Secondary | ICD-10-CM

## 2022-02-05 NOTE — Progress Notes (Signed)
No show

## 2022-02-09 NOTE — Progress Notes (Signed)
Surgical Instructions    Your procedure is scheduled on Wednesday October 11th.  Report to [facility] Main Entrance "A" at 10:15 A.M., then check in with the Admitting office.  Call this number if you have problems the morning of surgery:  [phone]   If you have any questions prior to your surgery date call [phone]: Open Monday-Friday 8am-4pm If you experience any cold or flu symptoms such as cough, fever, chills, shortness of breath, etc. between now and your scheduled surgery, please notify us at the above number     Remember:  Do not eat after midnight the night before your surgery  You may drink clear liquids until 9:15am the morning of your surgery.   Clear liquids allowed are: Water, Non-Citrus Juices (without pulp), Carbonated Beverages, Clear Tea, Black Coffee ONLY (NO MILK, CREAM OR POWDERED CREAMER of any kind), and Gatorade    Take these medicines the morning of surgery with A SIP OF WATER: cetirizine (ZYRTEC) 10 MG tablet famotidine (PEPCID) 20 MG tablet   IF NEEDED  acetaminophen (TYLENOL) 500 MG tablet    As of today, STOP taking any Aspirin (unless otherwise instructed by your surgeon) Aleve, Naproxen, Ibuprofen, Motrin, Advil, Goody's, BC's, all herbal medications, fish oil, and all vitamins.           Do not wear jewelry or makeup. Do not wear lotions, powders, perfumes or deodorant. Do not shave 48 hours prior to surgery.  Do not bring valuables to the hospital. Do not wear nail polish, gel polish, artificial nails, or any other type of covering on natural nails (fingers and toes) If you have artificial nails or gel coating that need to be removed by a nail salon, please have this removed prior to surgery. Artificial nails or gel coating may interfere with anesthesia's ability to adequately monitor your vital signs.  [place] is not responsible for any belongings or valuables.    Do NOT Smoke (Tobacco/Vaping)  24 hours prior to your  procedure  If you use a CPAP at night, you may bring your mask for your overnight stay.   Contacts, glasses, hearing aids, dentures or partials may not be worn into surgery, please bring cases for these belongings   For patients admitted to the hospital, discharge time will be determined by your treatment team.   Patients discharged the day of surgery will not be allowed to drive home, and someone needs to stay with them for 24 hours.   SURGICAL WAITING ROOM VISITATION Patients having surgery or a procedure may have no more than 2 support people in the waiting area - these visitors may rotate.   Children under the age of 83 must have an adult with them who is not the patient. If the patient needs to stay at the hospital during part of their recovery, the visitor guidelines for inpatient rooms apply. Pre-op nurse will coordinate an appropriate time for 1 support person to accompany patient in pre-op.  This support person may not rotate.   Please refer to the [facility] website for the visitor guidelines for Inpatients (after your surgery is over and you are in a regular room).    Special instructions:    Oral Hygiene is also important to reduce your risk of infection.  Remember - BRUSH YOUR TEETH THE MORNING OF SURGERY WITH YOUR REGULAR TOOTHPASTE   [place]- Preparing For Surgery  Before surgery, you can play an important role. Because skin is not sterile, your skin needs to be  as free of germs as possible. You can reduce the number of germs on your skin by washing with CHG (chlorahexidine gluconate) Soap before surgery.  CHG is an antiseptic cleaner which kills germs and bonds with the skin to continue killing germs even after washing.     Please do not use if you have an allergy to CHG or antibacterial soaps. If your skin becomes reddened/irritated stop using the CHG.  Do not shave (including legs and underarms) for at least 48 hours prior to first CHG shower. It is OK to shave  your face.  Please follow these instructions carefully.     Shower the NIGHT BEFORE SURGERY and the MORNING OF SURGERY with CHG Soap.   If you chose to wash your hair, wash your hair first as usual with your normal shampoo. After you shampoo, rinse your hair and body thoroughly to remove the shampoo.  Then [employer] and genitals (private parts) with your normal soap and rinse thoroughly to remove soap.  After that Use CHG Soap as you would any other liquid soap. You can apply CHG directly to the skin and wash gently with a scrungie or a clean washcloth.   Apply the CHG Soap to your body ONLY FROM THE NECK DOWN.  Do not use on open wounds or open sores. Avoid contact with your eyes, ears, mouth and genitals (private parts). Wash Face and genitals (private parts)  with your normal soap.   Wash thoroughly, paying special attention to the area where your surgery will be performed.  Thoroughly rinse your body with warm water from the neck down.  DO NOT shower/wash with your normal soap after using and rinsing off the CHG Soap.  Pat yourself dry with a CLEAN TOWEL.  Wear CLEAN PAJAMAS to bed the night before surgery  Place CLEAN SHEETS on your bed the night before your surgery  DO NOT SLEEP WITH PETS.   Day of Surgery:  Take a shower with CHG soap. Wear Clean/Comfortable clothing the morning of surgery Do not apply any deodorants/lotions.   Remember to brush your teeth WITH YOUR REGULAR TOOTHPASTE.    If you received a COVID test during your pre-op visit, it is requested that you wear a mask when out in public, stay away from anyone that may not be feeling well, and notify your surgeon if you develop symptoms. If you have been in contact with anyone that has tested positive in the last 10 days, please notify your surgeon.    Please read over the following fact sheets that you were given.

## 2022-02-10 ENCOUNTER — Encounter (HOSPITAL_COMMUNITY): Payer: Self-pay

## 2022-02-10 ENCOUNTER — Encounter (HOSPITAL_COMMUNITY)
Admission: RE | Admit: 2022-02-10 | Discharge: 2022-02-10 | Disposition: A | Discharge status: Home / Self Care | Payer: BC Managed Care – PPO | Source: Ambulatory Visit | Attending: Obstetrics and Gynecology | Admitting: Obstetrics and Gynecology

## 2022-02-10 DIAGNOSIS — Z01812 Encounter for preprocedural laboratory examination: Secondary | ICD-10-CM | POA: Insufficient documentation

## 2022-02-10 HISTORY — DX: Anxiety disorder, unspecified: F41.9

## 2022-02-10 LAB — BASIC METABOLIC PANEL
Anion gap: 8 (ref 5–15)
BUN: 15 mg/dL (ref 6–20)
CO2: 22 mmol/L (ref 22–32)
Calcium: 9.1 mg/dL (ref 8.9–10.3)
Chloride: 110 mmol/L (ref 98–111)
Creatinine, Ser: 0.56 mg/dL (ref 0.44–1.00)
GFR, Estimated: >60 mL/min (ref >60)
Glucose, Bld: 90 mg/dL (ref 70–99)
Potassium: 3.7 mmol/L (ref 3.5–5.1)
Sodium: 140 mmol/L (ref 135–145)

## 2022-02-10 LAB — CBC
HCT: 41.8 % (ref 36.0–46.0)
Hemoglobin: 13.8 g/dL (ref 12.0–15.0)
MCH: 28.5 pg (ref 26.0–34.0)
MCHC: 33 g/dL (ref 30.0–36.0)
MCV: 86.2 fL (ref 80.0–100.0)
Platelets: 253 10*3/uL (ref 150–400)
RBC: 4.85 MIL/uL (ref 3.87–5.11)
RDW: 12.4 % (ref 11.5–15.5)
WBC: 9.9 10*3/uL (ref 4.0–10.5)
nRBC: 0 % (ref 0.0–0.2)

## 2022-02-10 NOTE — Progress Notes (Signed)
Surgical Instructions    Your procedure is scheduled on Wednesday October 11th.  Report to [facility] Main Entrance "A" at 10:15 A.M., then check in with the Admitting office.  Call this number if you have problems the morning of surgery:  [phone]   If you have any questions prior to your surgery date call [phone]: Open Monday-Friday 8am-4pm If you experience any cold or flu symptoms such as cough, fever, chills, shortness of breath, etc. between now and your scheduled surgery, please notify us at the above number     Remember:  Do not eat or drink after midnight the night before your surgery      Take these medicines the morning of surgery with A SIP OF WATER: cetirizine (ZYRTEC)  famotidine (PEPCID)    IF NEEDED  acetaminophen (TYLENOL)     As of today, STOP taking any Aspirin (unless otherwise instructed by your surgeon) Aleve, Naproxen, Ibuprofen, Motrin, Advil, Goody's, BC's, all herbal medications, fish oil, and all vitamins.           Do not wear jewelry or makeup. Do not wear lotions, powders, perfumes or deodorant. Do not shave 48 hours prior to surgery.  Do not bring valuables to the hospital. Do not wear nail polish, gel polish, artificial nails, or any other type of covering on natural nails (fingers and toes) If you have artificial nails or gel coating that need to be removed by a nail salon, please have this removed prior to surgery. Artificial nails or gel coating may interfere with anesthesia's ability to adequately monitor your vital signs.  [facility] is not responsible for any belongings or valuables.    Do NOT Smoke (Tobacco/Vaping)  24 hours prior to your procedure  If you use a CPAP at night, you may bring your mask for your overnight stay.   Contacts, glasses, hearing aids, dentures or partials may not be worn into surgery, please bring cases for these belongings   For patients admitted to the hospital, discharge time will be determined  by your treatment team.   Patients discharged the day of surgery will not be allowed to drive home, and someone needs to stay with them for 24 hours.   SURGICAL WAITING ROOM VISITATION Patients having surgery or a procedure may have no more than 2 support people in the waiting area - these visitors may rotate.   Children under the age of 72 must have an adult with them who is not the patient. If the patient needs to stay at the hospital during part of their recovery, the visitor guidelines for inpatient rooms apply. Pre-op nurse will coordinate an appropriate time for 1 support person to accompany patient in pre-op.  This support person may not rotate.   Please refer to the [facility] website for the visitor guidelines for Inpatients (after your surgery is over and you are in a regular room).    Special instructions:    Oral Hygiene is also important to reduce your risk of infection.  Remember - BRUSH YOUR TEETH THE MORNING OF SURGERY WITH YOUR REGULAR TOOTHPASTE   [place]- Preparing For Surgery  Before surgery, you can play an important role. Because skin is not sterile, your skin needs to be as free of germs as possible. You can reduce the number of germs on your skin by washing with CHG (chlorahexidine gluconate) Soap before surgery.  CHG is an antiseptic cleaner which kills germs and bonds with the skin to continue killing germs even  after washing.     Please do not use if you have an allergy to CHG or antibacterial soaps. If your skin becomes reddened/irritated stop using the CHG.  Do not shave (including legs and underarms) for at least 48 hours prior to first CHG shower. It is OK to shave your face.  Please follow these instructions carefully.     Shower the NIGHT BEFORE SURGERY and the MORNING OF SURGERY with CHG Soap.   If you chose to wash your hair, wash your hair first as usual with your normal shampoo. After you shampoo, rinse your hair and body thoroughly to remove  the shampoo.  Then [employer] and genitals (private parts) with your normal soap and rinse thoroughly to remove soap.  After that Use CHG Soap as you would any other liquid soap. You can apply CHG directly to the skin and wash gently with a scrungie or a clean washcloth.   Apply the CHG Soap to your body ONLY FROM THE NECK DOWN.  Do not use on open wounds or open sores. Avoid contact with your eyes, ears, mouth and genitals (private parts). Wash Face and genitals (private parts)  with your normal soap.   Wash thoroughly, paying special attention to the area where your surgery will be performed.  Thoroughly rinse your body with warm water from the neck down.  DO NOT shower/wash with your normal soap after using and rinsing off the CHG Soap.  Pat yourself dry with a CLEAN TOWEL.  Wear CLEAN PAJAMAS to bed the night before surgery  Place CLEAN SHEETS on your bed the night before your surgery  DO NOT SLEEP WITH PETS.   Day of Surgery:  Take a shower with CHG soap. Wear Clean/Comfortable clothing the morning of surgery Do not apply any deodorants/lotions.   Remember to brush your teeth WITH YOUR REGULAR TOOTHPASTE.    If you received a COVID test during your pre-op visit, it is requested that you wear a mask when out in public, stay away from anyone that may not be feeling well, and notify your surgeon if you develop symptoms. If you have been in contact with anyone that has tested positive in the last 10 days, please notify your surgeon.    Please read over the following fact sheets that you were given.

## 2022-02-10 NOTE — Progress Notes (Signed)
CP - Dr.Roberts Cardiologist - denies  PPM/ICD - n/a Device Orders - n/a Rep Notified - n/a  Chest x-ray - n/a EKG - n/a Stress Test - denies ECHO - denies Cardiac Cath - denies  Sleep Study - denies CPAP - n/a  Blood Thinner Instructions: n/a  Follow your surgeon's instructions on when to stop Aspirin.  If no instructions were given by your surgeon then you will need to call the office to get those instructions.    NPO after MD  COVID TEST- N/A   Anesthesia review: NO  Patient denies shortness of breath, fever, cough and chest pain at PAT appointment   All instructions explained to the patient, with a verbal understanding of the material. Patient agrees to go over the instructions while at home for a better understanding. Patient also instructed to self quarantine after being tested for COVID-19. The opportunity to ask questions was provided.

## 2022-02-15 ENCOUNTER — Ambulatory Visit (INDEPENDENT_AMBULATORY_CARE_PROVIDER_SITE_OTHER): Payer: BC Managed Care – PPO | Admitting: Family Medicine

## 2022-02-16 ENCOUNTER — Ambulatory Visit (HOSPITAL_COMMUNITY): Payer: BC Managed Care – PPO

## 2022-02-16 ENCOUNTER — Encounter (HOSPITAL_COMMUNITY)
Admission: RE | Disposition: A | Discharge status: Home / Self Care | Payer: Self-pay | Source: Home / Self Care | Attending: Obstetrics and Gynecology

## 2022-02-16 ENCOUNTER — Other Ambulatory Visit: Payer: Self-pay

## 2022-02-16 ENCOUNTER — Ambulatory Visit (HOSPITAL_COMMUNITY)
Admission: RE | Admit: 2022-02-16 | Discharge: 2022-02-16 | Disposition: A | Discharge status: Home / Self Care | Payer: BC Managed Care – PPO | Source: Home / Self Care | Attending: Obstetrics and Gynecology | Admitting: Obstetrics and Gynecology

## 2022-02-16 DIAGNOSIS — N736 Female pelvic peritoneal adhesions (postinfective): Secondary | ICD-10-CM | POA: Insufficient documentation

## 2022-02-16 DIAGNOSIS — N80122 Deep endometriosis of left ovary: Secondary | ICD-10-CM | POA: Insufficient documentation

## 2022-02-16 DIAGNOSIS — Z6841 Body Mass Index (BMI) 40.0 and over, adult: Secondary | ICD-10-CM | POA: Insufficient documentation

## 2022-02-16 DIAGNOSIS — G8918 Other acute postprocedural pain: Secondary | ICD-10-CM | POA: Diagnosis not present

## 2022-02-16 DIAGNOSIS — K219 Gastro-esophageal reflux disease without esophagitis: Secondary | ICD-10-CM | POA: Insufficient documentation

## 2022-02-16 DIAGNOSIS — F17210 Nicotine dependence, cigarettes, uncomplicated: Secondary | ICD-10-CM | POA: Insufficient documentation

## 2022-02-16 HISTORY — PX: CHROMOPERTUBATION: SHX6288

## 2022-02-16 HISTORY — PX: LAPAROSCOPIC OVARIAN CYSTECTOMY: SHX6248

## 2022-02-16 LAB — CBC
HCT: 45.9 % (ref 36.0–46.0)
Hemoglobin: 14 g/dL (ref 12.0–15.0)
MCH: 28.7 pg (ref 26.0–34.0)
MCHC: 30.5 g/dL (ref 30.0–36.0)
MCV: 94.3 fL (ref 80.0–100.0)
Platelets: 226 10*3/uL (ref 150–400)
RBC: 4.87 MIL/uL (ref 3.87–5.11)
RDW: 12.7 % (ref 11.5–15.5)
WBC: 19.2 10*3/uL — ABNORMAL HIGH (ref 4.0–10.5)
nRBC: 0 % (ref 0.0–0.2)

## 2022-02-16 LAB — BASIC METABOLIC PANEL
Anion gap: 11 (ref 5–15)
BUN: 12 mg/dL (ref 6–20)
CO2: 17 mmol/L — ABNORMAL LOW (ref 22–32)
Calcium: 8.3 mg/dL — ABNORMAL LOW (ref 8.9–10.3)
Chloride: 113 mmol/L — ABNORMAL HIGH (ref 98–111)
Creatinine, Ser: 0.67 mg/dL (ref 0.44–1.00)
GFR, Estimated: >60 mL/min (ref >60)
Glucose, Bld: 158 mg/dL — ABNORMAL HIGH (ref 70–99)
Potassium: 4 mmol/L (ref 3.5–5.1)
Sodium: 141 mmol/L (ref 135–145)

## 2022-02-16 LAB — POCT PREGNANCY, URINE: Preg Test, Ur: NEGATIVE

## 2022-02-16 SURGERY — EXCISION, CYST, OVARY, LAPAROSCOPIC
Anesthesia: General | Site: Abdomen

## 2022-02-16 MED ORDER — OXYCODONE-ACETAMINOPHEN 5-325 MG PO TABS: 1.0000 | ORAL_TABLET | ORAL | 0 refills | Status: AC | PRN

## 2022-02-16 MED ORDER — EPHEDRINE 5 MG/ML INJ
INTRAVENOUS | Status: AC
  Filled 2022-02-16: qty 5

## 2022-02-16 MED ORDER — OXYCODONE HCL 5 MG PO TABS
ORAL_TABLET | ORAL | Status: AC
  Filled 2022-02-16: qty 1

## 2022-02-16 MED ORDER — PHENYLEPHRINE 80 MCG/ML (10ML) SYRINGE FOR IV PUSH (FOR BLOOD PRESSURE SUPPORT)
PREFILLED_SYRINGE | INTRAVENOUS | Status: AC
  Filled 2022-02-16: qty 20

## 2022-02-16 MED ORDER — OXYCODONE HCL 5 MG/5ML PO SOLN: 5.0000 mg | Freq: Once | ORAL | Status: AC | PRN

## 2022-02-16 MED ORDER — CHLORHEXIDINE GLUCONATE 0.12 % MT SOLN
15.0000 mL | Freq: Once | OROMUCOSAL | Status: AC
  Administered 2022-02-16: 15 mL via OROMUCOSAL
  Filled 2022-02-16: qty 15

## 2022-02-16 MED ORDER — SODIUM CHLORIDE 0.9 % IR SOLN
Status: DC | PRN
  Administered 2022-02-16: 1000 mL
  Administered 2022-02-16: 3000 mL

## 2022-02-16 MED ORDER — LACTATED RINGERS IV SOLN: INTRAVENOUS | Status: DC

## 2022-02-16 MED ORDER — FENTANYL CITRATE (PF) 100 MCG/2ML IJ SOLN
INTRAMUSCULAR | Status: AC
  Filled 2022-02-16: qty 2

## 2022-02-16 MED ORDER — DEXAMETHASONE SODIUM PHOSPHATE 10 MG/ML IJ SOLN
INTRAMUSCULAR | Status: DC | PRN
  Administered 2022-02-16: 10 mg via INTRAVENOUS

## 2022-02-16 MED ORDER — MIDAZOLAM HCL 2 MG/2ML IJ SOLN
INTRAMUSCULAR | Status: DC | PRN
  Administered 2022-02-16: 2 mg via INTRAVENOUS

## 2022-02-16 MED ORDER — FENTANYL CITRATE (PF) 250 MCG/5ML IJ SOLN
INTRAMUSCULAR | Status: DC | PRN
  Administered 2022-02-16 (×2): 100 ug via INTRAVENOUS
  Administered 2022-02-16: 50 ug via INTRAVENOUS

## 2022-02-16 MED ORDER — HYDROMORPHONE HCL 1 MG/ML IJ SOLN
INTRAMUSCULAR | Status: DC | PRN
  Administered 2022-02-16 (×2): .5 mg via INTRAVENOUS

## 2022-02-16 MED ORDER — CEFAZOLIN IN SODIUM CHLORIDE 3-0.9 GM/100ML-% IV SOLN
3.0000 g | INTRAVENOUS | Status: AC
  Administered 2022-02-16 (×2): 3 g via INTRAVENOUS
  Filled 2022-02-16: qty 100

## 2022-02-16 MED ORDER — CALCIUM CHLORIDE 10 % IV SOLN
INTRAVENOUS | Status: AC
  Filled 2022-02-16: qty 10

## 2022-02-16 MED ORDER — KETOROLAC TROMETHAMINE 30 MG/ML IJ SOLN
INTRAMUSCULAR | Status: AC
  Filled 2022-02-16: qty 1

## 2022-02-16 MED ORDER — HYDROMORPHONE HCL 1 MG/ML IJ SOLN
INTRAMUSCULAR | Status: AC
  Filled 2022-02-16: qty 0.5

## 2022-02-16 MED ORDER — IBUPROFEN 600 MG PO TABS: 600.0000 mg | ORAL_TABLET | Freq: Four times a day (QID) | ORAL | 1 refills | Status: AC | PRN

## 2022-02-16 MED ORDER — FENTANYL CITRATE (PF) 250 MCG/5ML IJ SOLN
INTRAMUSCULAR | Status: AC
  Filled 2022-02-16: qty 5

## 2022-02-16 MED ORDER — SUGAMMADEX SODIUM 200 MG/2ML IV SOLN
INTRAVENOUS | Status: DC | PRN
  Administered 2022-02-16: 300 mg via INTRAVENOUS

## 2022-02-16 MED ORDER — SUCCINYLCHOLINE CHLORIDE 200 MG/10ML IV SOSY
PREFILLED_SYRINGE | INTRAVENOUS | Status: DC | PRN
  Administered 2022-02-16: 120 mg via INTRAVENOUS

## 2022-02-16 MED ORDER — ORAL CARE MOUTH RINSE: 15.0000 mL | Freq: Once | OROMUCOSAL | Status: AC

## 2022-02-16 MED ORDER — ACETAMINOPHEN 500 MG PO TABS
1000.0000 mg | ORAL_TABLET | Freq: Once | ORAL | Status: AC
  Administered 2022-02-16: 1000 mg via ORAL
  Filled 2022-02-16: qty 2

## 2022-02-16 MED ORDER — BUPIVACAINE HCL (PF) 0.25 % IJ SOLN
INTRAMUSCULAR | Status: DC | PRN
  Administered 2022-02-16: 10 mL

## 2022-02-16 MED ORDER — ONDANSETRON HCL 4 MG/2ML IJ SOLN
INTRAMUSCULAR | Status: DC | PRN
  Administered 2022-02-16: 4 mg via INTRAVENOUS

## 2022-02-16 MED ORDER — BUPIVACAINE HCL (PF) 0.25 % IJ SOLN
INTRAMUSCULAR | Status: AC
  Filled 2022-02-16: qty 30

## 2022-02-16 MED ORDER — SODIUM CHLORIDE 0.9 % IR SOLN
Status: DC | PRN
  Administered 2022-02-16: 3000 mL

## 2022-02-16 MED ORDER — ROCURONIUM BROMIDE 10 MG/ML (PF) SYRINGE
PREFILLED_SYRINGE | INTRAVENOUS | Status: DC | PRN
  Administered 2022-02-16: 100 mg via INTRAVENOUS
  Administered 2022-02-16: 50 mg via INTRAVENOUS
  Administered 2022-02-16: 20 mg via INTRAVENOUS

## 2022-02-16 MED ORDER — POVIDONE-IODINE 10 % EX SWAB
2.0000 | Freq: Once | CUTANEOUS | Status: AC
  Administered 2022-02-16: 2 via TOPICAL

## 2022-02-16 MED ORDER — PHENYLEPHRINE HCL-NACL 20-0.9 MG/250ML-% IV SOLN
INTRAVENOUS | Status: DC | PRN
  Administered 2022-02-16: 160 ug via INTRAVENOUS
  Administered 2022-02-16 (×2): 80 ug via INTRAVENOUS
  Administered 2022-02-16: 200 ug via INTRAVENOUS
  Administered 2022-02-16 (×6): 80 ug via INTRAVENOUS
  Administered 2022-02-16: 50 ug/min via INTRAVENOUS
  Administered 2022-02-16: 160 ug via INTRAVENOUS

## 2022-02-16 MED ORDER — TRANEXAMIC ACID-NACL 1000-0.7 MG/100ML-% IV SOLN
INTRAVENOUS | Status: AC
  Filled 2022-02-16: qty 100

## 2022-02-16 MED ORDER — ONDANSETRON HCL 4 MG/2ML IJ SOLN
INTRAMUSCULAR | Status: AC
  Filled 2022-02-16: qty 2

## 2022-02-16 MED ORDER — TRANEXAMIC ACID-NACL 1000-0.7 MG/100ML-% IV SOLN
INTRAVENOUS | Status: DC | PRN
  Administered 2022-02-16: 1000 mg via INTRAVENOUS

## 2022-02-16 MED ORDER — LIDOCAINE 2% (20 MG/ML) 5 ML SYRINGE
INTRAMUSCULAR | Status: DC | PRN
  Administered 2022-02-16: 60 mg via INTRAVENOUS

## 2022-02-16 MED ORDER — PROPOFOL 10 MG/ML IV BOLUS
INTRAVENOUS | Status: DC | PRN
  Administered 2022-02-16: 200 mg via INTRAVENOUS

## 2022-02-16 MED ORDER — FENTANYL CITRATE (PF) 100 MCG/2ML IJ SOLN
25.0000 ug | INTRAMUSCULAR | Status: DC | PRN
  Administered 2022-02-16 (×2): 50 ug via INTRAVENOUS

## 2022-02-16 MED ORDER — EPHEDRINE SULFATE-NACL 50-0.9 MG/10ML-% IV SOSY
PREFILLED_SYRINGE | INTRAVENOUS | Status: DC | PRN
  Administered 2022-02-16: 5 mg via INTRAVENOUS
  Administered 2022-02-16: 10 mg via INTRAVENOUS
  Administered 2022-02-16 (×2): 5 mg via INTRAVENOUS
  Administered 2022-02-16: 10 mg via INTRAVENOUS

## 2022-02-16 MED ORDER — DEXAMETHASONE SODIUM PHOSPHATE 10 MG/ML IJ SOLN
INTRAMUSCULAR | Status: AC
  Filled 2022-02-16: qty 1

## 2022-02-16 MED ORDER — PROPOFOL 10 MG/ML IV BOLUS
INTRAVENOUS | Status: AC
  Filled 2022-02-16: qty 20

## 2022-02-16 MED ORDER — PROMETHAZINE HCL 25 MG/ML IJ SOLN: 6.2500 mg | INTRAMUSCULAR | Status: DC | PRN

## 2022-02-16 MED ORDER — SCOPOLAMINE 1 MG/3DAYS TD PT72
MEDICATED_PATCH | TRANSDERMAL | Status: DC | PRN
  Administered 2022-02-16: 1 via TRANSDERMAL

## 2022-02-16 MED ORDER — KETOROLAC TROMETHAMINE 30 MG/ML IJ SOLN
INTRAMUSCULAR | Status: DC | PRN
  Administered 2022-02-16: 30 mg via INTRAVENOUS

## 2022-02-16 MED ORDER — MIDAZOLAM HCL 2 MG/2ML IJ SOLN
INTRAMUSCULAR | Status: AC
  Filled 2022-02-16: qty 2

## 2022-02-16 MED ORDER — ROCURONIUM BROMIDE 10 MG/ML (PF) SYRINGE
PREFILLED_SYRINGE | INTRAVENOUS | Status: AC
  Filled 2022-02-16: qty 20

## 2022-02-16 MED ORDER — OXYCODONE HCL 5 MG PO TABS
5.0000 mg | ORAL_TABLET | Freq: Once | ORAL | Status: AC | PRN
  Administered 2022-02-16: 5 mg via ORAL

## 2022-02-16 SURGICAL SUPPLY — 75 items
ADH SKN CLS APL DERMABOND .7 (GAUZE/BANDAGES/DRESSINGS) ×2
APL SRG 38 LTWT LNG FL B (MISCELLANEOUS) ×2
APPLICATOR ARISTA FLEXITIP XL (MISCELLANEOUS) ×1 IMPLANT
BAG COUNTER SPONGE SURGICOUNT (BAG) ×3 IMPLANT
BAG SPEC RTRVL LRG 6X4 10 (ENDOMECHANICALS)
BAG SPNG CNTER NS LX DISP (BAG) ×2
BARRIER ADHS 3X4 INTERCEED (GAUZE/BANDAGES/DRESSINGS) ×3 IMPLANT
BLADE SURG 10 STRL SS (BLADE) ×6 IMPLANT
BRR ADH 4X3 ABS CNTRL BYND (GAUZE/BANDAGES/DRESSINGS) ×6
CABLE HIGH FREQUENCY MONO STRZ (ELECTRODE) IMPLANT
CANISTER SUCT 3000ML PPV (MISCELLANEOUS) ×3 IMPLANT
DERMABOND ADVANCED .7 DNX12 (GAUZE/BANDAGES/DRESSINGS) ×3 IMPLANT
DILATOR CANAL MILEX (MISCELLANEOUS) ×1 IMPLANT
DISSECTOR SPONGE CHERRY (GAUZE/BANDAGES/DRESSINGS) IMPLANT
DRAPE WARM FLUID 44X44 (DRAPES) IMPLANT
DRSG OPSITE POSTOP 3X4 (GAUZE/BANDAGES/DRESSINGS) IMPLANT
DURAPREP 26ML APPLICATOR (WOUND CARE) ×6 IMPLANT
GAUZE 4X4 16PLY ~~LOC~~+RFID DBL (SPONGE) IMPLANT
GLOVE BIO SURGEON STRL SZ7.5 (GLOVE) ×3 IMPLANT
GLOVE BIOGEL PI IND STRL 7.0 (GLOVE) ×6 IMPLANT
GLOVE BIOGEL PI IND STRL 7.5 (GLOVE) ×3 IMPLANT
GLOVE SURG ENC MOIS LTX SZ7.5 (GLOVE) ×3 IMPLANT
GLOVE SURG UNDER LTX SZ7.5 (GLOVE) ×6 IMPLANT
GOWN STRL REUS W/ TWL LRG LVL3 (GOWN DISPOSABLE) ×6 IMPLANT
GOWN STRL REUS W/TWL LRG LVL3 (GOWN DISPOSABLE) ×4
HEMOSTAT ARISTA ABSORB 3G PWDR (HEMOSTASIS) ×1 IMPLANT
HEMOSTAT SURGICEL 4X8 (HEMOSTASIS) IMPLANT
HIBICLENS CHG 4% 4OZ BTL (MISCELLANEOUS) ×3 IMPLANT
IRRIG SUCT STRYKERFLOW 2 WTIP (MISCELLANEOUS)
IRRIGATION SUCT STRKRFLW 2 WTP (MISCELLANEOUS) IMPLANT
KIT TURNOVER KIT B (KITS) ×3 IMPLANT
LIGASURE VESSEL 5MM BLUNT TIP (ELECTROSURGICAL) IMPLANT
NDL INSUFFLATION 14GA 120MM (NEEDLE) ×2 IMPLANT
NEEDLE HYPO 22GX1.5 SAFETY (NEEDLE) IMPLANT
NEEDLE INSUFFLATION 14GA 120MM (NEEDLE) ×2 IMPLANT
NS IRRIG 1000ML POUR BTL (IV SOLUTION) ×3 IMPLANT
PACK ABDOMINAL GYN (CUSTOM PROCEDURE TRAY) ×3 IMPLANT
PACK LAPAROSCOPY BASIN (CUSTOM PROCEDURE TRAY) ×3 IMPLANT
PACK TRENDGUARD 450 HYBRID PRO (MISCELLANEOUS) IMPLANT
PAD OB MATERNITY 4.3X12.25 (PERSONAL CARE ITEMS) ×3 IMPLANT
PENCIL BUTTON HOLSTER BLD 10FT (ELECTRODE) ×3 IMPLANT
POUCH SPECIMEN RETRIEVAL 10MM (ENDOMECHANICALS) IMPLANT
PROTECTOR NERVE ULNAR (MISCELLANEOUS) ×6 IMPLANT
SCISSORS LAP 5X35 DISP (ENDOMECHANICALS) IMPLANT
SET TUBE SMOKE EVAC HIGH FLOW (TUBING) ×3 IMPLANT
SLEEVE ENDOPATH XCEL 5M (ENDOMECHANICALS) ×3 IMPLANT
SPIKE FLUID TRANSFER (MISCELLANEOUS) IMPLANT
SPONGE T-LAP 18X18 ~~LOC~~+RFID (SPONGE) ×3 IMPLANT
STAPLER VISISTAT 35W (STAPLE) IMPLANT
SUT CHROMIC 2 0 CT 1 (SUTURE) ×3 IMPLANT
SUT CHROMIC 2 0 SH (SUTURE) IMPLANT
SUT MNCRL AB 3-0 PS2 27 (SUTURE) ×6 IMPLANT
SUT MON AB 3-0 SH 27 (SUTURE)
SUT MON AB 3-0 SH27 (SUTURE) IMPLANT
SUT PDS AB 1 CTX 36 (SUTURE) IMPLANT
SUT PLAIN 2 0 XLH (SUTURE) ×3 IMPLANT
SUT VIC AB 0 CT1 18XCR BRD8 (SUTURE) IMPLANT
SUT VIC AB 0 CT1 27 (SUTURE) ×8
SUT VIC AB 0 CT1 27XBRD ANBCTR (SUTURE) ×12 IMPLANT
SUT VIC AB 0 CT1 8-18 (SUTURE)
SUT VICRYL 0 ENDOLOOP (SUTURE) IMPLANT
SUT VICRYL 0 TIES 12 18 (SUTURE) ×3 IMPLANT
SUT VICRYL 0 UR6 27IN ABS (SUTURE) ×4 IMPLANT
SYR 50ML LL SCALE MARK (SYRINGE) IMPLANT
SYR CONTROL 10ML LL (SYRINGE) IMPLANT
SYS BAG RETRIEVAL 10MM (BASKET) ×2
SYSTEM BAG RETRIEVAL 10MM (BASKET) ×1 IMPLANT
TOWEL GREEN STERILE FF (TOWEL DISPOSABLE) ×6 IMPLANT
TRAY FOLEY W/BAG SLVR 14FR (SET/KITS/TRAYS/PACK) ×3 IMPLANT
TRENDGUARD 450 HYBRID PRO PACK (MISCELLANEOUS)
TROCAR 5MMX150MM (TROCAR) ×2 IMPLANT
TROCAR XCEL NON-BLD 11X100MML (ENDOMECHANICALS) ×3 IMPLANT
TROCAR XCEL NON-BLD 5MMX100MML (ENDOMECHANICALS) ×3 IMPLANT
TROCAR Z THREAD OPTICAL 12X150 (TROCAR) ×1 IMPLANT
WARMER LAPAROSCOPE (MISCELLANEOUS) ×3 IMPLANT

## 2022-02-16 NOTE — Anesthesia Procedure Notes (Signed)
Procedure Name: Intubation Date/Time: 02/16/2022 12:47 PM  Performed by: Lance Coon, CRNAPre-anesthesia Checklist: Patient identified, Emergency Drugs available, Suction available and Patient being monitored Patient Re-evaluated:Patient Re-evaluated prior to induction Oxygen Delivery Method: Circle System Utilized Preoxygenation: Pre-oxygenation with 100% oxygen Induction Type: IV induction Ventilation: Mask ventilation without difficulty Laryngoscope Size: Mac and 3 Grade View: Grade I Tube type: Oral Tube size: 7.0 mm Number of attempts: 1 Airway Equipment and Method: Stylet Placement Confirmation: ETT inserted through vocal cords under direct vision, positive ETCO2 and breath sounds checked- equal and bilateral Secured at: 22 cm Tube secured with: Tape Dental Injury: Teeth and Oropharynx as per pre-operative assessment  Comments: Performed by Encarnacion Chu, SRNA

## 2022-02-16 NOTE — H&P (Signed)
[name] is an 31 y.o. female. Pt has been having prolonged pain and has a known endometrioma and is scheduled for surgical mgmt today.  Pertinent Gynecological History: Menstrual History:  Patient's last menstrual period was 02/07/2022.    Past Medical History:  Diagnosis Date   Anxiety    Endometrioma of ovary 08/11/2020   Gastroesophageal reflux    Heart murmur    "born with heart murmur"    Past Surgical History:  Procedure Laterality Date   CHOLECYSTECTOMY  2009   COLONOSCOPY  01/21/2011   Procedure: COLONOSCOPY;  Surgeon: Corbin Ade, MD;  [facility]: [facility];  Service: Endoscopy;  Laterality: N/A;  11:30   EXTERNAL EAR SURGERY     right    Family History  Problem Relation Age of Onset   Stroke Mother    Heart failure Mother    Fibromyalgia Mother    Hypertension Father    Hepatitis C Father    Hypertension Brother    Cancer Paternal Uncle    Crohn's disease Maternal Grandmother    Colon cancer Maternal Grandfather        died at age 68 from complications with pancreatitis   Cancer Paternal Grandmother     Social History:  reports that she has been smoking cigarettes. She has been smoking an average of .5 packs per day. She has never used smokeless tobacco. She reports that she does not drink alcohol and does not use drugs.  Allergies:  Allergies  Allergen Reactions   Bactrim [Sulfamethoxazole-Trimethoprim] Other (See Comments)    blisters on mouth    Medications Prior to Admission  Medication Sig Dispense Refill Last Dose   acetaminophen (TYLENOL) 500 MG tablet Take 1,000 mg by mouth every 6 (six) hours as needed (pain.).      Acetaminophen-Aspirin Buffered (EXCEDRIN BACK & BODY PO) Take 2 tablets by mouth 3 (three) times daily as needed (pain.).      cetirizine (ZYRTEC) 10 MG tablet Take 10 mg by mouth in the morning.      famotidine (PEPCID) 20 MG tablet Take 1 tablet (20 mg total) by mouth 2 (two) times daily. 60 tablet 2     ibuprofen (ADVIL) 200 MG tablet Take 800 mg by mouth every 8 (eight) hours as needed (pain.).      ibuprofen (ADVIL) 600 MG tablet Take 1 tablet (600 mg total) by mouth every 8 (eight) hours as needed. (Patient not taking: Reported on 02/04/2022) 30 tablet 0 Not Taking   methocarbamol (ROBAXIN) 500 MG tablet Take 1 tablet (500 mg total) by mouth 4 (four) times daily. (Patient not taking: Reported on 02/04/2022) 30 tablet 0 Not Taking   predniSONE (DELTASONE) 20 MG tablet Take 1 tablet (20 mg total) by mouth daily with breakfast. (Patient not taking: Reported on 02/04/2022) 6 tablet 0 Not Taking    Review of Systems Denies F/C/N/V/D  Blood pressure 113/67, pulse (!) 59, temperature 98.1 F (36.7 C), resp. rate 18, height 6' (1.829 m), weight (!) 150.6 kg, last menstrual period 02/07/2022, SpO2 96 %. Physical Exam Lungs CTA CV RRR Abdomen soft, NT Extremities no calf tenderness  Assessment/Plan: P0 presenting for laparoscopic ovarian cystectomy, possible laparotomy, chromopertubation, possible bilateral salpingectomy.  She understands the procedure may just be diagnostic and is ok with both fallopian tubes being removed if damaged and the need for IVF in the future for conception if she decides to have children.  Right now she is not leaning toward having children. Risks  benefits alternatives reviewed including but not limited to bleeding infection injury to surrounding organs including bowel and bladder.  Questions answered and consent signed and witnessed.  Delice Lesch 02/16/2022, 10:32 AM

## 2022-02-16 NOTE — Anesthesia Preprocedure Evaluation (Addendum)
Anesthesia Evaluation  Patient identified by MRN, date of birth, ID band Patient awake    Reviewed: Allergy & Precautions, NPO status , Patient's Chart, lab work & pertinent test results  History of Anesthesia Complications Negative for: history of anesthetic complications  Airway Mallampati: II  TM Distance: >3 FB Neck ROM: Full    Dental  (+) Dental Advisory Given, Teeth Intact   Pulmonary Current Smoker and Patient abstained from smoking.,    Pulmonary exam normal        Cardiovascular negative cardio ROS Normal cardiovascular exam     Neuro/Psych PSYCHIATRIC DISORDERS Anxiety negative neurological ROS     GI/Hepatic Neg liver ROS, GERD  Controlled and Medicated,  Endo/Other  Morbid obesity  Renal/GU negative Renal ROS     Musculoskeletal negative musculoskeletal ROS (+)   Abdominal (+) + obese,   Peds  Hematology negative hematology ROS (+)   Anesthesia Other Findings   Reproductive/Obstetrics  Endometriosis                             Anesthesia Physical Anesthesia Plan  ASA: 3  Anesthesia Plan: General   Post-op Pain Management: Tylenol PO (pre-op)* and Toradol IV (intra-op)*   Induction: Intravenous  PONV Risk Score and Plan: 3 and Treatment may vary due to age or medical condition, Ondansetron, Dexamethasone, Midazolam and Scopolamine patch - Pre-op  Airway Management Planned: Oral ETT  Additional Equipment: None  Intra-op Plan:   Post-operative Plan: Extubation in OR  Informed Consent: I have reviewed the patients History and Physical, chart, labs and discussed the procedure including the risks, benefits and alternatives for the proposed anesthesia with the patient or authorized representative who has indicated his/her understanding and acceptance.     Dental advisory given  Plan Discussed with: CRNA and Anesthesiologist  Anesthesia Plan Comments:         Anesthesia Quick Evaluation

## 2022-02-16 NOTE — Transfer of Care (Signed)
Immediate Anesthesia Transfer of Care Note  Patient: [name]  Procedure(s) Performed: LAPAROSCOPIC OVARIAN CYSTECTOMY (Abdomen) CHROMOPERTUBATION (Abdomen)  Patient Location: PACU  Anesthesia Type:General  Level of Consciousness: drowsy and patient cooperative  Airway & Oxygen Therapy: Patient Spontanous Breathing  Post-op Assessment: Report given to RN and Post -op Vital signs reviewed and stable  Post vital signs: Reviewed and stable  Last Vitals:  Vitals Value Taken Time  BP 115/69 02/16/22 1757  Temp    Pulse 93 02/16/22 1800  Resp 20 02/16/22 1800  SpO2 92 % 02/16/22 1800  Vitals shown include unvalidated device data.  Last Pain:  Vitals:   02/16/22 1051  PainSc: 5       Patients Stated Pain Goal: 2 (65/03/54 6568)  Complications: No notable events documented.

## 2022-02-16 NOTE — Op Note (Signed)
Preop Diagnosis: 1.ENDOMETRIOSIS OF OVARY 2.PELVIC PAIN  Postop Diagnosis: 1.ENDOMETRIOSIS OF OVARY 2.PELVIC PAIN 3.PELVIC ADHESIVE DISEASE  Procedure: LAPAROSCOPIC OVARIAN LEFT CYSTECTOMY CHROMOPERTUBATION LOA  Anesthesia: General   Anesthesiologist: See Flowsheet   Attending: Everett Graff, MD   Assistant:  Waymon Amato, MD  Findings: Left ovary with multiple endometriomas largest about 5cm.  Pelvic adhesions.  Dye expelled from one tube but due to midline location and significant adhesions, unable to determine which tube although the left tube appeared mildly dilated.  Right tube and ovary were encased in adhesions but not enlarged.  The left fallopian tube was wrapped around the left ovary and bowel adhesions attached.  Pathology: Left endometrioma cyst wall  Fluids: 2000 cc  UOP: 300 cc  EBL: 161 cc  Complications: None  Procedure:  The patient was taken to the operating room after the risks, benefits, alternatives, complications, treatment options, and expected outcomes were discussed with the patient. The patient verbalized understanding, the patient concurred with the proposed plan and consent signed and witnessed. The patient was taken to the Operating Room and identified as [name] and the procedure verified as laparoscopic left cystectomy.  The patient was placed under general anesthesia per anesthesia staff, the patient was placed in modified dorsal lithotomy position and was prepped, draped, and catheterized in the normal, sterile fashion.  A Time Out was held and the above information confirmed.  The cervix was visualized and an intrauterine manipulator was placed.  A 10 mm umbilical incision was then performed. Veress needle was passed and pneumoperitoneum was established. A 10 mm trocar was advanced into the intraabdominal cavity, the laparoscope was introduced and findings as noted above.  Patient was placed in trendelenburg and marcaine injected in  the LLQ and a 5 mm incision was made and 5 mm trocar advanced into the intraabdominal cavity.  The same was done in the RLQ and a 10 mm in the suprapubic area.   Using the monopolar, the largest 5cm cyst was incised and cyst wall removed via dissection, hydrodissection and excision with ligasure.  Chocolate fluid was released upon rupture.  Copious irrigation and suctioning was performed.  The same was done on a lower cyst that was adherent in the posterior cul de sac.  Attempts at removing cyst wall was performed but due to adhesive disease the cyst wall was unable to be removed in its entirety.  Copious irrigation and suctioning was performed. Hemostasis was obtained with the bipolar kleppinger.  Arista was placed in the beds of the cyst and interceed placed over the ovary anteriorly and posteriorly in the cul de sac.  The 10 mm suprapubic trocar was removed and fascia repaired with 0 vicryl.  Right and left trocars were removed as well under direct visualization.  Pneumoperitoneum was relieved and umbilical trocar removed under direct visualization.  The umbilical fascia was repaired with 0 vicryl.  The 10 mm skin incisions were repaired with 3-0 monocryl via a subcuticular stitch and dermabond was applied to all incisions.    Sponge, instrument, lap and needle counts were correct.  The patient tolerated the procedure well and was awaiting extubation and transfer to the recovery room.   An assistant was needed due to the complexity of the anatomy.

## 2022-02-17 ENCOUNTER — Encounter (HOSPITAL_COMMUNITY): Payer: Self-pay | Admitting: Obstetrics and Gynecology

## 2022-02-17 ENCOUNTER — Emergency Department (HOSPITAL_COMMUNITY): Payer: BC Managed Care – PPO

## 2022-02-17 ENCOUNTER — Inpatient Hospital Stay (HOSPITAL_COMMUNITY)
Admission: EM | Admit: 2022-02-17 | Discharge: 2022-02-20 | DRG: 742 | Disposition: A | Discharge status: Home / Self Care | Payer: BC Managed Care – PPO | Attending: Obstetrics and Gynecology | Admitting: Obstetrics and Gynecology

## 2022-02-17 DIAGNOSIS — N80122 Deep endometriosis of left ovary: Principal | ICD-10-CM | POA: Diagnosis present

## 2022-02-17 DIAGNOSIS — G8918 Other acute postprocedural pain: Secondary | ICD-10-CM | POA: Diagnosis present

## 2022-02-17 DIAGNOSIS — R102 Pelvic and perineal pain: Secondary | ICD-10-CM

## 2022-02-17 DIAGNOSIS — E669 Obesity, unspecified: Secondary | ICD-10-CM | POA: Diagnosis present

## 2022-02-17 DIAGNOSIS — D62 Acute posthemorrhagic anemia: Secondary | ICD-10-CM | POA: Diagnosis not present

## 2022-02-17 DIAGNOSIS — Z79899 Other long term (current) drug therapy: Secondary | ICD-10-CM

## 2022-02-17 DIAGNOSIS — J9811 Atelectasis: Secondary | ICD-10-CM

## 2022-02-17 DIAGNOSIS — Z882 Allergy status to sulfonamides status: Secondary | ICD-10-CM

## 2022-02-17 DIAGNOSIS — T8132XA Disruption of internal operation (surgical) wound, not elsewhere classified, initial encounter: Secondary | ICD-10-CM | POA: Diagnosis not present

## 2022-02-17 DIAGNOSIS — K430 Incisional hernia with obstruction, without gangrene: Secondary | ICD-10-CM | POA: Diagnosis present

## 2022-02-17 DIAGNOSIS — F17210 Nicotine dependence, cigarettes, uncomplicated: Secondary | ICD-10-CM | POA: Diagnosis present

## 2022-02-17 DIAGNOSIS — R110 Nausea: Secondary | ICD-10-CM

## 2022-02-17 DIAGNOSIS — K219 Gastro-esophageal reflux disease without esophagitis: Secondary | ICD-10-CM | POA: Diagnosis present

## 2022-02-17 DIAGNOSIS — D649 Anemia, unspecified: Principal | ICD-10-CM

## 2022-02-17 DIAGNOSIS — R112 Nausea with vomiting, unspecified: Secondary | ICD-10-CM

## 2022-02-17 DIAGNOSIS — Z6841 Body Mass Index (BMI) 40.0 and over, adult: Secondary | ICD-10-CM

## 2022-02-17 DIAGNOSIS — J189 Pneumonia, unspecified organism: Secondary | ICD-10-CM | POA: Diagnosis present

## 2022-02-17 DIAGNOSIS — J9589 Other postprocedural complications and disorders of respiratory system, not elsewhere classified: Secondary | ICD-10-CM

## 2022-02-17 DIAGNOSIS — N736 Female pelvic peritoneal adhesions (postinfective): Secondary | ICD-10-CM | POA: Diagnosis present

## 2022-02-17 LAB — CBC WITH DIFFERENTIAL/PLATELET
Abs Immature Granulocytes: 0.08 10*3/uL — ABNORMAL HIGH (ref 0.00–0.07)
Basophils Absolute: 0 10*3/uL (ref 0.0–0.1)
Basophils Relative: 0 %
Eosinophils Absolute: 0 10*3/uL (ref 0.0–0.5)
Eosinophils Relative: 0 %
HCT: 35.4 % — ABNORMAL LOW (ref 36.0–46.0)
Hemoglobin: 11.4 g/dL — ABNORMAL LOW (ref 12.0–15.0)
Immature Granulocytes: 1 %
Lymphocytes Relative: 11 %
Lymphs Abs: 1.7 10*3/uL (ref 0.7–4.0)
MCH: 28.1 pg (ref 26.0–34.0)
MCHC: 32.2 g/dL (ref 30.0–36.0)
MCV: 87.4 fL (ref 80.0–100.0)
Monocytes Absolute: 0.8 10*3/uL (ref 0.1–1.0)
Monocytes Relative: 5 %
Neutro Abs: 13.2 10*3/uL — ABNORMAL HIGH (ref 1.7–7.7)
Neutrophils Relative %: 83 %
Platelets: 215 10*3/uL (ref 150–400)
RBC: 4.05 MIL/uL (ref 3.87–5.11)
RDW: 12.9 % (ref 11.5–15.5)
WBC: 15.8 10*3/uL — ABNORMAL HIGH (ref 4.0–10.5)
nRBC: 0 % (ref 0.0–0.2)

## 2022-02-17 LAB — BASIC METABOLIC PANEL
Anion gap: 10 (ref 5–15)
BUN: 10 mg/dL (ref 6–20)
CO2: 21 mmol/L — ABNORMAL LOW (ref 22–32)
Calcium: 8.5 mg/dL — ABNORMAL LOW (ref 8.9–10.3)
Chloride: 107 mmol/L (ref 98–111)
Creatinine, Ser: 0.68 mg/dL (ref 0.44–1.00)
GFR, Estimated: >60 mL/min (ref >60)
Glucose, Bld: 129 mg/dL — ABNORMAL HIGH (ref 70–99)
Potassium: 3.8 mmol/L (ref 3.5–5.1)
Sodium: 138 mmol/L (ref 135–145)

## 2022-02-17 LAB — LACTIC ACID, PLASMA: Lactic Acid, Venous: 1.5 mmol/L (ref 0.5–1.9)

## 2022-02-17 LAB — D-DIMER, QUANTITATIVE: D-Dimer, Quant: 8.41 ug/mL-FEU — ABNORMAL HIGH (ref 0.00–0.50)

## 2022-02-17 LAB — I-STAT BETA HCG BLOOD, ED (MC, WL, AP ONLY): I-stat hCG, quantitative: <5 m[IU]/mL (ref <5)

## 2022-02-17 MED ORDER — ONDANSETRON 4 MG PO TBDP
4.0000 mg | ORAL_TABLET | Freq: Once | ORAL | Status: AC | PRN
  Administered 2022-02-17: 4 mg via ORAL
  Filled 2022-02-17: qty 1

## 2022-02-17 MED ORDER — HYDROMORPHONE HCL 1 MG/ML IJ SOLN
0.5000 mg | Freq: Once | INTRAMUSCULAR | Status: AC
  Administered 2022-02-17: 0.5 mg via INTRAMUSCULAR
  Filled 2022-02-17: qty 1

## 2022-02-17 MED ORDER — ALBUTEROL SULFATE HFA 108 (90 BASE) MCG/ACT IN AERS: 2.0000 | INHALATION_SPRAY | RESPIRATORY_TRACT | Status: DC | PRN

## 2022-02-17 NOTE — Anesthesia Postprocedure Evaluation (Signed)
Anesthesia Post Note  Patient: [name]  Procedure(s) Performed: LAPAROSCOPIC OVARIAN CYSTECTOMY (Abdomen) CHROMOPERTUBATION (Abdomen)     Patient location during evaluation: PACU Anesthesia Type: General Level of consciousness: awake and alert Pain management: pain level controlled Vital Signs Assessment: post-procedure vital signs reviewed and stable Respiratory status: spontaneous breathing, nonlabored ventilation, respiratory function stable and patient connected to nasal cannula oxygen Cardiovascular status: blood pressure returned to baseline and stable Postop Assessment: no apparent nausea or vomiting Anesthetic complications: no   No notable events documented.  Last Vitals:  Vitals:   02/16/22 2100 02/16/22 2115  BP: 118/83 (!) 117/92  Pulse: 75 71  Resp: 17 16  Temp: 36.5 C 36.5 C  SpO2: 93% 94%    Last Pain:  Vitals:   02/16/22 2115  PainSc: [place] Daymian Lill

## 2022-02-17 NOTE — ED Triage Notes (Signed)
Hx endometriosis with cyst removal yesterday at [facility]. Today reports difficulty eating, weak, tired, SOB, productive cough-clear phlegm. No BM yesterday or today, painful urination. Sent home with ibuprofen and percocet- no relief. Last dose 1500.

## 2022-02-17 NOTE — ED Provider Triage Note (Signed)
Emergency Medicine Provider Triage Evaluation Note  [name] , a 31 y.o. female  was evaluated in triage.  Pt complains of worsening stomach pain, nausea, and increasing shortness of breath.  Recently had procedure for her endometriosis.  Symptoms began soon after the procedure, then began to progress.  Unsure of fevers.  States "something is not right".  Significant fatigue, decreased appetite, and difficulty eating.  Tried ibuprofen and Percocet with no relief, last dose at 1500 today.  Review of Systems  Positive:  Negative: See above  Physical Exam  BP 130/79   Pulse 74   Temp 98.9 F (37.2 C) (Oral)   Resp 20   Ht 6' (1.829 m)   Wt (!) 150 kg   LMP 02/07/2022   SpO2 95%   BMI 44.85 kg/m  Gen:   Awake, no distress   Resp:  Tachypnea respiratory rate at 22 to 24/min.  Shallow breaths.  Equal chest rise. MSK:   Moves extremities without difficulty  Other:  3 excisional wounds appear to be healing without evidence of infection, though with mild ecchymosis surrounding and with significant TTP.  Abdomen soft, protuberant, without obvious mass.  Medical Decision Making  Medically screening exam initiated at 7:53 PM.  Appropriate orders placed.  SUJEY GUNDRY was informed that the remainder of the evaluation will be completed by another provider, this initial triage assessment does not replace that evaluation, and the importance of remaining in the ED until their evaluation is complete.     Prince Rome, PA-C 35/46/56 [id number]

## 2022-02-18 ENCOUNTER — Observation Stay (HOSPITAL_COMMUNITY): Payer: BC Managed Care – PPO

## 2022-02-18 ENCOUNTER — Emergency Department (HOSPITAL_COMMUNITY): Payer: BC Managed Care – PPO

## 2022-02-18 ENCOUNTER — Other Ambulatory Visit: Payer: Self-pay

## 2022-02-18 ENCOUNTER — Encounter (HOSPITAL_COMMUNITY): Payer: Self-pay | Admitting: Obstetrics and Gynecology

## 2022-02-18 DIAGNOSIS — G8918 Other acute postprocedural pain: Secondary | ICD-10-CM | POA: Diagnosis present

## 2022-02-18 DIAGNOSIS — E669 Obesity, unspecified: Secondary | ICD-10-CM

## 2022-02-18 DIAGNOSIS — Z882 Allergy status to sulfonamides status: Secondary | ICD-10-CM | POA: Diagnosis not present

## 2022-02-18 DIAGNOSIS — D62 Acute posthemorrhagic anemia: Secondary | ICD-10-CM | POA: Diagnosis not present

## 2022-02-18 DIAGNOSIS — K430 Incisional hernia with obstruction, without gangrene: Secondary | ICD-10-CM | POA: Diagnosis present

## 2022-02-18 DIAGNOSIS — R110 Nausea: Secondary | ICD-10-CM

## 2022-02-18 DIAGNOSIS — J189 Pneumonia, unspecified organism: Secondary | ICD-10-CM | POA: Diagnosis present

## 2022-02-18 DIAGNOSIS — J9589 Other postprocedural complications and disorders of respiratory system, not elsewhere classified: Secondary | ICD-10-CM

## 2022-02-18 DIAGNOSIS — F17210 Nicotine dependence, cigarettes, uncomplicated: Secondary | ICD-10-CM | POA: Diagnosis present

## 2022-02-18 DIAGNOSIS — Z79899 Other long term (current) drug therapy: Secondary | ICD-10-CM | POA: Diagnosis not present

## 2022-02-18 DIAGNOSIS — T8132XA Disruption of internal operation (surgical) wound, not elsewhere classified, initial encounter: Secondary | ICD-10-CM | POA: Diagnosis not present

## 2022-02-18 DIAGNOSIS — N736 Female pelvic peritoneal adhesions (postinfective): Secondary | ICD-10-CM | POA: Diagnosis present

## 2022-02-18 DIAGNOSIS — Z6841 Body Mass Index (BMI) 40.0 and over, adult: Secondary | ICD-10-CM | POA: Diagnosis not present

## 2022-02-18 DIAGNOSIS — K219 Gastro-esophageal reflux disease without esophagitis: Secondary | ICD-10-CM | POA: Diagnosis present

## 2022-02-18 DIAGNOSIS — N80122 Deep endometriosis of left ovary: Secondary | ICD-10-CM | POA: Diagnosis present

## 2022-02-18 LAB — CBC WITH DIFFERENTIAL/PLATELET
Abs Immature Granulocytes: 0.16 10*3/uL — ABNORMAL HIGH (ref 0.00–0.07)
Basophils Absolute: 0 10*3/uL (ref 0.0–0.1)
Basophils Relative: 0 %
Eosinophils Absolute: 0 10*3/uL (ref 0.0–0.5)
Eosinophils Relative: 0 %
HCT: 32.3 % — ABNORMAL LOW (ref 36.0–46.0)
Hemoglobin: 10.8 g/dL — ABNORMAL LOW (ref 12.0–15.0)
Immature Granulocytes: 1 %
Lymphocytes Relative: 9 %
Lymphs Abs: 1.2 10*3/uL (ref 0.7–4.0)
MCH: 28.2 pg (ref 26.0–34.0)
MCHC: 33.4 g/dL (ref 30.0–36.0)
MCV: 84.3 fL (ref 80.0–100.0)
Monocytes Absolute: 0.8 10*3/uL (ref 0.1–1.0)
Monocytes Relative: 6 %
Neutro Abs: 12.2 10*3/uL — ABNORMAL HIGH (ref 1.7–7.7)
Neutrophils Relative %: 84 %
Platelets: 179 10*3/uL (ref 150–400)
RBC: 3.83 MIL/uL — ABNORMAL LOW (ref 3.87–5.11)
RDW: 13.2 % (ref 11.5–15.5)
WBC: 14.4 10*3/uL — ABNORMAL HIGH (ref 4.0–10.5)
nRBC: 0 % (ref 0.0–0.2)

## 2022-02-18 LAB — HEMOGLOBIN AND HEMATOCRIT, BLOOD
HCT: 32.2 % — ABNORMAL LOW (ref 36.0–46.0)
Hemoglobin: 10.2 g/dL — ABNORMAL LOW (ref 12.0–15.0)

## 2022-02-18 LAB — PROTIME-INR
INR: 1.2 (ref 0.8–1.2)
Prothrombin Time: 15.4 seconds — ABNORMAL HIGH (ref 11.4–15.2)

## 2022-02-18 LAB — ABO/RH: ABO/RH(D): B POS

## 2022-02-18 LAB — TYPE AND SCREEN
ABO/RH(D): B POS
Antibody Screen: NEGATIVE

## 2022-02-18 LAB — URINALYSIS, ROUTINE W REFLEX MICROSCOPIC
Bilirubin Urine: NEGATIVE
Glucose, UA: NEGATIVE mg/dL
Ketones, ur: 5 mg/dL — AB
Nitrite: NEGATIVE
Protein, ur: 100 mg/dL — AB
RBC / HPF: >50 RBC/hpf — ABNORMAL HIGH (ref 0–5)
Specific Gravity, Urine: 1.026 (ref 1.005–1.030)
pH: 5 (ref 5.0–8.0)

## 2022-02-18 MED ORDER — IOHEXOL 350 MG/ML SOLN
100.0000 mL | Freq: Once | INTRAVENOUS | Status: AC | PRN
  Administered 2022-02-18: 100 mL via INTRAVENOUS

## 2022-02-18 MED ORDER — LIDOCAINE 5 % EX PTCH
1.0000 | MEDICATED_PATCH | CUTANEOUS | Status: DC
  Administered 2022-02-18 – 2022-02-20 (×3): 1 via TRANSDERMAL
  Filled 2022-02-18 (×3): qty 1

## 2022-02-18 MED ORDER — FENTANYL CITRATE PF 50 MCG/ML IJ SOSY
100.0000 ug | PREFILLED_SYRINGE | Freq: Once | INTRAMUSCULAR | Status: AC
  Administered 2022-02-18: 100 ug via INTRAVENOUS
  Filled 2022-02-18: qty 2

## 2022-02-18 MED ORDER — SENNOSIDES-DOCUSATE SODIUM 8.6-50 MG PO TABS
1.0000 | ORAL_TABLET | Freq: Every day | ORAL | Status: DC
  Administered 2022-02-18: 1 via ORAL
  Filled 2022-02-18: qty 1

## 2022-02-18 MED ORDER — SODIUM CHLORIDE 0.9 % IV SOLN
500.0000 mg | INTRAVENOUS | Status: DC
  Administered 2022-02-18 – 2022-02-20 (×3): 500 mg via INTRAVENOUS
  Filled 2022-02-18 (×3): qty 5

## 2022-02-18 MED ORDER — METOCLOPRAMIDE HCL 5 MG/ML IJ SOLN
10.0000 mg | Freq: Once | INTRAMUSCULAR | Status: AC
  Administered 2022-02-18: 10 mg via INTRAVENOUS
  Filled 2022-02-18: qty 2

## 2022-02-18 MED ORDER — KETOROLAC TROMETHAMINE 30 MG/ML IJ SOLN
30.0000 mg | Freq: Four times a day (QID) | INTRAMUSCULAR | Status: DC
  Administered 2022-02-18 – 2022-02-19 (×6): 30 mg via INTRAVENOUS
  Filled 2022-02-18 (×6): qty 1

## 2022-02-18 MED ORDER — ONDANSETRON HCL 4 MG/2ML IJ SOLN
4.0000 mg | Freq: Once | INTRAMUSCULAR | Status: AC
  Administered 2022-02-18: 4 mg via INTRAVENOUS
  Filled 2022-02-18: qty 2

## 2022-02-18 MED ORDER — LACTATED RINGERS IV BOLUS
1000.0000 mL | Freq: Once | INTRAVENOUS | Status: AC
  Administered 2022-02-18: 1000 mL via INTRAVENOUS

## 2022-02-18 MED ORDER — HYDROMORPHONE HCL 1 MG/ML IJ SOLN
1.0000 mg | INTRAMUSCULAR | Status: DC | PRN
  Administered 2022-02-18 – 2022-02-19 (×2): 1 mg via INTRAVENOUS
  Filled 2022-02-18 (×2): qty 1

## 2022-02-18 MED ORDER — SODIUM CHLORIDE 0.9 % IV BOLUS
1000.0000 mL | Freq: Once | INTRAVENOUS | Status: AC
  Administered 2022-02-18: 1000 mL via INTRAVENOUS

## 2022-02-18 MED ORDER — SIMETHICONE 80 MG PO CHEW
80.0000 mg | CHEWABLE_TABLET | Freq: Four times a day (QID) | ORAL | Status: DC
  Administered 2022-02-18 – 2022-02-20 (×6): 80 mg via ORAL
  Filled 2022-02-18 (×7): qty 1

## 2022-02-18 MED ORDER — FENTANYL CITRATE PF 50 MCG/ML IJ SOSY: 50.0000 ug | PREFILLED_SYRINGE | INTRAMUSCULAR | Status: DC | PRN

## 2022-02-18 MED ORDER — SODIUM CHLORIDE 0.9 % IV SOLN
2.0000 g | Freq: Once | INTRAVENOUS | Status: AC
  Administered 2022-02-18: 2 g via INTRAVENOUS
  Filled 2022-02-18: qty 20

## 2022-02-18 MED ORDER — ACETAMINOPHEN 500 MG PO TABS
1000.0000 mg | ORAL_TABLET | Freq: Once | ORAL | Status: AC
  Administered 2022-02-18: 1000 mg via ORAL
  Filled 2022-02-18: qty 2

## 2022-02-18 MED ORDER — FENTANYL CITRATE PF 50 MCG/ML IJ SOSY
50.0000 ug | PREFILLED_SYRINGE | Freq: Once | INTRAMUSCULAR | Status: AC
  Administered 2022-02-18: 50 ug via INTRAVENOUS
  Filled 2022-02-18: qty 1

## 2022-02-18 MED ORDER — SODIUM CHLORIDE 0.9 % IV SOLN: INTRAVENOUS | Status: DC

## 2022-02-18 MED ORDER — ACETAMINOPHEN 500 MG PO TABS
1000.0000 mg | ORAL_TABLET | Freq: Four times a day (QID) | ORAL | Status: DC
  Administered 2022-02-18 – 2022-02-19 (×4): 1000 mg via ORAL
  Filled 2022-02-18 (×5): qty 2

## 2022-02-18 MED ORDER — GABAPENTIN 100 MG PO CAPS
100.0000 mg | ORAL_CAPSULE | Freq: Three times a day (TID) | ORAL | Status: DC
  Administered 2022-02-18 – 2022-02-20 (×7): 100 mg via ORAL
  Filled 2022-02-18 (×7): qty 1

## 2022-02-18 MED ORDER — SODIUM CHLORIDE 0.9 % IV SOLN
12.5000 mg | Freq: Four times a day (QID) | INTRAVENOUS | Status: DC | PRN
  Administered 2022-02-18 – 2022-02-19 (×3): 12.5 mg via INTRAVENOUS
  Filled 2022-02-18 (×2): qty 0.5
  Filled 2022-02-18: qty 12.5

## 2022-02-18 MED ORDER — SODIUM CHLORIDE 0.9 % IV SOLN
2.0000 g | INTRAVENOUS | Status: DC
  Administered 2022-02-19 – 2022-02-20 (×2): 2 g via INTRAVENOUS
  Filled 2022-02-18 (×2): qty 20

## 2022-02-18 MED ORDER — ONDANSETRON HCL 4 MG/2ML IJ SOLN
4.0000 mg | Freq: Four times a day (QID) | INTRAMUSCULAR | Status: DC | PRN
  Administered 2022-02-18 – 2022-02-19 (×3): 4 mg via INTRAVENOUS
  Filled 2022-02-18 (×3): qty 2

## 2022-02-18 NOTE — ED Notes (Signed)
Paramedic assisted with straight stick unsuccessful.  Pt has been stuck a total of 6 times by three different people. All attempts unsuccessful.

## 2022-02-18 NOTE — Progress Notes (Signed)
Subjective: Patient reports cough and abdominal pain. She has passed flatus once.  Denies a BM for the past week.  Objective: I have reviewed patient's vital signs.  General: alert and no distress Resp: decreased at bases Cardio: regular rate and rhythm GI: soft, mildly tender diffusely with no rebound or guarding Extremities: no calf tenderness Vaginal Bleeding: minimal No calf tenderness  Assessment: POD #2 s/p Laparoscopic Left Ovarian cystectomy readmitted with pneumonia and N/V.  Feels better than when she was admitted but still does not feel well.  Pt has finding on CT scan of non-obstructed hernia but it was without po contrast.  No dilated loops of bowel visualized.  Hernia may have been present already but may have worsened with all of the recent coughing or it may be incisional and occurred as a result of the coughing as well.  Plan: Continued observation Repeat labs Clears Voiding spont SCDs to LE and lovenox for DVT prophylaxis Reglan for nausea If vomiting recurs, plan KUB Constipation - will give Mg citrate   LOS: 0 days    Delice Lesch, MD 02/18/2022, 1:20 PM

## 2022-02-18 NOTE — ED Provider Notes (Signed)
[facility] EMERGENCY DEPARTMENT Provider Note   CSN: [id number] Arrival date & time: 02/17/22  1654     History  No chief complaint on file.   [name] is a 31 y.o. female.  31 yo F here with mostly abdominal pain for the last 24 hours. Had laparoscopic surgery yesterday for endometriosis. Had some pain even afterwards. Today has felt week. Has diffuse abdominal pain but seems worse in lower abdomen. Worse when she breathes as well. Felt chills but no fevers. Felt nauseous. Note eating much today. Urine decreased and dark.    Home Medications Prior to Admission medications   Medication Sig Start Date End Date Taking? Authorizing Provider  cetirizine (ZYRTEC) 10 MG tablet Take 10 mg by mouth in the morning.   Yes [provider]  famotidine (PEPCID) 20 MG tablet Take 1 tablet (20 mg total) by mouth 2 (two) times daily. 12/14/21  Yes Daryll Drown, NP  ibuprofen (ADVIL) 600 MG tablet Take 1 tablet (600 mg total) by mouth every 6 (six) hours as needed for mild pain, moderate pain or cramping. 02/16/22  Yes Osborn Coho, MD  norethindrone (AYGESTIN) 5 MG tablet Take 10 mg by mouth daily. 02/10/22  Yes [provider]  oxyCODONE-acetaminophen (PERCOCET) 5-325 MG tablet Take 1 tablet by mouth every 4 (four) hours as needed for severe pain. 02/16/22  Yes Osborn Coho, MD      Allergies    Bactrim [sulfamethoxazole-trimethoprim]    Review of Systems   Review of Systems  Physical Exam Updated Vital Signs BP (!) 113/47   Pulse 86   Temp 98.1 F (36.7 C) (Oral)   Resp (!) 26   Ht 6' (1.829 m)   Wt (!) 150 kg   LMP 02/07/2022   SpO2 95%   BMI 44.85 kg/m  Physical Exam Vitals and nursing note reviewed.  Constitutional:      Appearance: She is well-developed.  HENT:     Head: Normocephalic and atraumatic.  Eyes:     Pupils: Pupils are equal, round, and reactive to light.  Cardiovascular:     Rate and Rhythm: Normal rate  and regular rhythm.  Pulmonary:     Effort: No respiratory distress.     Breath sounds: No stridor.     Comments: Clear but had albuterol earlier.  Abdominal:     General: There is no distension.     Tenderness: There is abdominal tenderness (diffuse). There is no guarding or rebound.     Hernia: No hernia is present.  Musculoskeletal:     Cervical back: Normal range of motion.  Skin:    General: Skin is warm and dry.     Comments: Ecchymosis surrounding the three incisions. But otherwise appear as expected.   Neurological:     General: No focal deficit present.     Mental Status: She is alert.     ED Results / Procedures / Treatments   Labs (all labs ordered are listed, but only abnormal results are displayed) Labs Reviewed  URINALYSIS, ROUTINE W REFLEX MICROSCOPIC - Abnormal; Notable for the following components:      Result Value   Color, Urine AMBER (*)    APPearance HAZY (*)    Hgb urine dipstick MODERATE (*)    Ketones, ur 5 (*)    Protein, ur 100 (*)    Leukocytes,Ua TRACE (*)    RBC / HPF >50 (*)    Bacteria, UA RARE (*)  All other components within normal limits  D-DIMER, QUANTITATIVE - Abnormal; Notable for the following components:   D-Dimer, Quant 8.41 (*)    All other components within normal limits  BASIC METABOLIC PANEL - Abnormal; Notable for the following components:   CO2 21 (*)    Glucose, Bld 129 (*)    Calcium 8.5 (*)    All other components within normal limits  CBC WITH DIFFERENTIAL/PLATELET - Abnormal; Notable for the following components:   WBC 15.8 (*)    Hemoglobin 11.4 (*)    HCT 35.4 (*)    Neutro Abs 13.2 (*)    Abs Immature Granulocytes 0.08 (*)    All other components within normal limits  PROTIME-INR - Abnormal; Notable for the following components:   Prothrombin Time 15.4 (*)    All other components within normal limits  HEMOGLOBIN AND HEMATOCRIT, BLOOD - Abnormal; Notable for the following components:   Hemoglobin 10.2 (*)     HCT 32.2 (*)    All other components within normal limits  LACTIC ACID, PLASMA  LACTIC ACID, PLASMA  CBC WITH DIFFERENTIAL/PLATELET  CBC WITH DIFFERENTIAL/PLATELET  CBC WITH DIFFERENTIAL/PLATELET  CBC WITH DIFFERENTIAL/PLATELET  CBC WITH DIFFERENTIAL/PLATELET  I-STAT BETA HCG BLOOD, ED ([facility], [facility], [facility] ONLY)  TYPE AND SCREEN  ABO/RH    EKG None  Radiology [place] ASCITES (ABDOMEN LIMITED)  Result Date: 02/18/2022 CLINICAL DATA:  Free fluid in the pelvis. Status post laparoscopic ovarian cystectomy for endometriosis. EXAM: LIMITED ABDOMEN ULTRASOUND FOR ASCITES TECHNIQUE: Limited ultrasound survey for ascites was performed in all four abdominal quadrants. COMPARISON:  02/18/2022. FINDINGS: No free fluid in the right upper quadrant, right lower quadrant, left lower quadrant and left upper quadrant. The pelvis was not specifically imaged on this exam. IMPRESSION: No evidence of ascites. Electronically Signed   By: Thornell Sartorius M.D.   On: 02/18/2022 04:26   CT Angio Chest/Abd/Pel for Dissection W and/or Wo Contrast  Addendum Date: 02/18/2022   ADDENDUM REPORT: 02/18/2022 01:46 ADDENDUM: Findings were reported to Dr. Marily Memos at 1:45 a.m. Electronically Signed   By: Thornell Sartorius M.D.   On: 02/18/2022 01:46   Result Date: 02/18/2022 CLINICAL DATA:  Acute aortic syndrome suspected. Worsening stomach pain, nausea, and shortness of breath. EXAM: CT ANGIOGRAPHY CHEST, ABDOMEN AND PELVIS TECHNIQUE: Non-contrast CT of the chest was initially obtained. Multidetector CT imaging through the chest, abdomen and pelvis was performed using the standard protocol during bolus administration of intravenous contrast. Multiplanar reconstructed images and MIPs were obtained and reviewed to evaluate the vascular anatomy. RADIATION DOSE REDUCTION: This exam was performed according to the departmental dose-optimization program which includes automated exposure control, adjustment of the mA and/or kV according to  patient size and/or use of iterative reconstruction technique. CONTRAST:  OMNIPAQUE IOHEXOL 350 MG/ML SOLN COMPARISON:  08/03/2012. FINDINGS: CTA CHEST FINDINGS Cardiovascular: The heart is normal in size and there is no pericardial effusion. The aorta is without evidence of aneurysm or dissection. The pulmonary trunk is normal in caliber. Mediastinum/Nodes: No mediastinal, hilar, or axillary lymphadenopathy. The thyroid gland, trachea, and esophagus are within normal limits. Lungs/Pleura: Strandy opacities are noted in the lungs bilaterally and there is consolidation in the medial aspect of the right lower lobe. No effusion or pneumothorax. Musculoskeletal: No acute osseous abnormality. Review of the MIP images confirms the above findings. CTA ABDOMEN AND PELVIS FINDINGS VASCULAR Aorta: No evidence of aortic aneurysm or dissection, however exam is extremely limited due to patient's body habitus. Celiac: Patent.  SMA: Patent. Renals: Patent.  Accessory renal artery on the right. IMA: Proximal IMA is patent.  The mid to distal IMA is not seen. Inflow: Patent. Veins: No obvious venous abnormality within the limitations of this arterial phase study. Review of the MIP images confirms the above findings. NON-VASCULAR Hepatobiliary: No focal liver abnormality is seen. Status post cholecystectomy. No biliary dilatation. Pancreas: Unremarkable. No pancreatic ductal dilatation or surrounding inflammatory changes. Spleen: Normal in size without focal abnormality. Adrenals/Urinary Tract: Adrenal glands are unremarkable. Kidneys are normal, without renal calculi, focal lesion, or hydronephrosis. Air is present in the urinary bladder which may be iatrogenic. Stomach/Bowel: Stomach is within normal limits. Appendix is not seen. No evidence of bowel wall thickening, distention, or inflammatory changes. Examination is limited due to lack of oral contrast. A ventral abdominal wall hernia is noted in the right lower quadrant  containing nonobstructed bowel. Lymphatic: No abdominal or pelvic lymphadenopathy Reproductive: The uterus is not well delineated on exam. No obvious adnexal mass. Other: Examination is extremely limited due to patient's body habitus. Small amount of hyperdense free fluid in the pelvis, possible blood products. Multiple foci of free air are noted in the pelvis and at the porta hepatis which may be due to history of recent surgery. Musculoskeletal: Subcutaneous emphysema is noted in the anterior abdominopelvic wall and upper thighs. No acute osseous abnormality. Review of the MIP images confirms the above findings. IMPRESSION: 1. Free air in the abdomen and pelvis and subcutaneous emphysema, may be related to history of recent surgery. Clinical correlation is recommended. 2. No evidence of aortic aneurysm or dissection. Examination is extremely limited due to patient's body habitus. 3. Small amount of hyperdense free fluid in the pelvis, possible blood products. 4. Scattered airspace disease in the lungs bilaterally with right lower lobe consolidation. 5. Right lower quadrant ventral abdominal wall hernia containing nonobstructed small bowel. Electronically Signed: By: Thornell Sartorius M.D. On: 02/18/2022 01:39   DG Chest 2 View  Result Date: 02/17/2022 CLINICAL DATA:  Short of breath EXAM: CHEST - 2 VIEW COMPARISON:  Chest two-view 01/27/2007 FINDINGS: Hypoventilation. Decreased lung volume with bibasilar atelectasis. No infiltrate or effusion. Vascularity normal. IMPRESSION: Hypoventilation with bibasilar atelectasis. Electronically Signed   By: Marlan Palau M.D.   On: 02/17/2022 19:37    Procedures Procedures    Medications Ordered in ED Medications  albuterol (VENTOLIN HFA) 108 (90 Base) MCG/ACT inhaler 2 puff (has no administration in time range)  azithromycin (ZITHROMAX) 500 mg in sodium chloride 0.9 % 250 mL IVPB (0 mg Intravenous Stopped 02/18/22 0430)  fentaNYL (SUBLIMAZE) injection 50 mcg (has  no administration in time range)  fentaNYL (SUBLIMAZE) injection 50 mcg (has no administration in time range)  acetaminophen (TYLENOL) tablet 1,000 mg (has no administration in time range)  promethazine (PHENERGAN) 12.5 mg in sodium chloride 0.9 % 50 mL IVPB (has no administration in time range)  ondansetron (ZOFRAN-ODT) disintegrating tablet 4 mg (4 mg Oral Given 02/17/22 1827)  HYDROmorphone (DILAUDID) injection 0.5 mg (0.5 mg Intramuscular Given 02/17/22 1957)  fentaNYL (SUBLIMAZE) injection 100 mcg (100 mcg Intravenous Given 02/18/22 0046)  sodium chloride 0.9 % bolus 1,000 mL (0 mLs Intravenous Stopped 02/18/22 0327)  ondansetron (ZOFRAN) injection 4 mg (4 mg Intravenous Given 02/18/22 0045)  iohexol (OMNIPAQUE) 350 MG/ML injection 100 mL (100 mLs Intravenous Contrast Given 02/18/22 0121)  lactated ringers bolus 1,000 mL (0 mLs Intravenous Stopped 02/18/22 0430)  cefTRIAXone (ROCEPHIN) 2 g in sodium chloride 0.9 % 100 mL IVPB (0  g Intravenous Stopped 02/18/22 0327)  metoCLOPramide (REGLAN) injection 10 mg (10 mg Intravenous Given 02/18/22 0335)  fentaNYL (SUBLIMAZE) injection 100 mcg (100 mcg Intravenous Given 02/18/22 6811)    ED Course/ Medical Decision Making/ A&P                           Medical Decision Making Amount and/or Complexity of Data Reviewed Independent Historian: spouse Labs: ordered.    Details: Hemoglobin down from 14 yesterday to 12.4 and subsequently 11.8. Radiology: ordered and independent interpretation performed.    Details: Ct with some heterogenous free fluid c/w blood. Free air likely post surgical.  [place] without obvious free fluid.   Risk OTC drugs. Prescription drug management. Decision regarding hospitalization.  Review of labs from triage show decreased Hb from yesterday and elevated troponin. CXR clear. Had dilaudid IM which seemed to help some. Is a bit nausous.  Symptoms seem peritoneal in nature as the pain is in upper abdomen when she breathes  but lower abdomen at other times. Her hemoglobin is significantly lower than I would expect based on op note so I wonder if she is having ongoing bleeding. Will need CTA to rule out PE so will scan abdomen as well to ensure no ongoing bleeding. Symptomatic treatment in mean time.  Patient not able to tolerate PO or pain after multiple pain medications. Hemoglobin dropped since last night even further. CT with some e/o hemoperitoneum, recommended [place], negative for abdominal fluid. D/w gynecology for admission.   Final Clinical Impression(s) / ED Diagnoses Final diagnoses:  Anemia, unspecified type  Pelvic pain  Nausea and vomiting, unspecified vomiting type    Rx / DC Orders ED Discharge Orders     None         Ellen Mayol, Corene Cornea, MD 02/18/22 [phone]

## 2022-02-18 NOTE — ED Notes (Signed)
Pt vomited green/yellow bile. RN and spouse assisted pt changing linens and gown

## 2022-02-18 NOTE — ED Notes (Signed)
UA at the bedside

## 2022-02-18 NOTE — ED Notes (Signed)
Pt stated she feels weird in her stomach. Pt can not quite describe it. Pt states she feels week in the stomach

## 2022-02-18 NOTE — ED Notes (Signed)
Requests no cardiac monitoring.

## 2022-02-18 NOTE — ED Notes (Signed)
RN attempted to get CBC blood draw. Pt IV will not pull back and attempted straight stick unsuccessful. Notified Phlebotomy

## 2022-02-18 NOTE — ED Notes (Signed)
Pt provided incentive spirometer and educated to take 10 inhaled breaths each hour. Each time she should aim to beat the exhale amount. Pt and spouse verbalized understanding and RN answered all questions.

## 2022-02-18 NOTE — ED Notes (Signed)
RN attempted blood draw again. Unsuccessful. Notified Attending

## 2022-02-18 NOTE — H&P (Signed)
[name] is an 31 y.o. female.  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  Patient was discharged on postop day 0 after procedure and presented to the ED on postop day 1 with abdominal pain not controlled with PO analgesia, shortness of breath, and nausea.  She denies any fever, vomiting, vaginal bleeding, abnormal vaginal discharge.  Of note patient's D-dimer was elevated so CTA ordered to rule out PE and negative.  CT did demonstrate small amount of blood in the pelvis, lung consolidations (pneumonia versus atelectasis), and free air in the peritoneum (likely from laparoscopic procedure).  Patient's hemoglobin did drop from 14.09 (pre-op) to 11.4 (post-op) and EBL was 200.  While in ED patient's hemoglobin further dropped to 10.2, status post IV fluid bolus.    Patient's last menstrual period was 02/07/2022.    Past Medical History:  Diagnosis Date   Anxiety    Endometrioma of ovary 08/11/2020   Gastroesophageal reflux    Heart murmur    "born with heart murmur"    Past Surgical History:  Procedure Laterality Date   CHOLECYSTECTOMY  2009   CHROMOPERTUBATION N/A 02/16/2022   Procedure: CHROMOPERTUBATION;  Surgeon: Victoria Coho, MD;  Location: [facility] OR;  Service: Gynecology;  Laterality: N/A;   COLONOSCOPY  01/21/2011   Procedure: COLONOSCOPY;  Surgeon: Corbin Ade, MD;  Location: [facility];  Service: Endoscopy;  Laterality: N/A;  11:30   EXTERNAL EAR SURGERY     right   LAPAROSCOPIC OVARIAN CYSTECTOMY N/A 02/16/2022   Procedure: LAPAROSCOPIC OVARIAN CYSTECTOMY;  Surgeon: Victoria Coho, MD;  Location: [facility] OR;  Service: Gynecology;  Laterality: N/A;    Family History  Problem Relation Age of Onset   Stroke Mother    Heart failure Mother    Fibromyalgia Mother    Hypertension Father    Hepatitis C Father    Hypertension Brother     Cancer Paternal Uncle    Crohn's disease Maternal Grandmother    Colon cancer Maternal Grandfather        died at age 24 from complications with pancreatitis   Cancer Paternal Grandmother     Social History:  reports that she has been smoking cigarettes. She has been smoking an average of .5 packs per day. She has never used smokeless tobacco. She reports that she does not drink alcohol and does not use drugs.  Allergies:  Allergies  Allergen Reactions   Bactrim [Sulfamethoxazole-Trimethoprim] Other (See Comments)    blisters on mouth    (Not in a hospital admission)   Review of Systems  Constitutional:        Pertinent ROS noted in HPI    Blood pressure (!) 113/47, pulse 86, temperature 98.6 F (37 C), temperature source Oral, resp. rate (!) 26, height 6' (1.829 m), weight (!) 150 kg, last menstrual period 02/07/2022, SpO2 95 %.  Physical Exam Vitals and nursing note reviewed.  Constitutional:      Appearance: She is well-developed.  HENT:     Head: Normocephalic and atraumatic.  Eyes:     Pupils: Pupils are equal, round, and reactive to light.  Cardiovascular:     Rate and Rhythm: Normal rate and regular rhythm.  Pulmonary:     Effort: No respiratory distress. Tachypnea     Breath sounds: No stridor.  Abdominal:     General: There is no distension.  Tenderness: There is abdominal tenderness (diffuse). There is no guarding or rebound.     Hernia: No hernia is present.  Musculoskeletal:     Cervical back: Normal range of motion.  Skin:    General: Skin is warm and dry.     Comments: Ecchymosis surrounding the three incisions. But otherwise appear as expected. TTP of incisions Neurological:     General: No focal deficit present.     Mental Status: She is alert.   Results for orders placed or performed during the hospital encounter of 02/17/22 (from the past 24 hour(s))  D-dimer, quantitative     Status: Abnormal   Collection Time: 02/17/22  8:18 PM  Result  Value Ref Range   D-Dimer, Quant 8.41 (H) 0.00 - 0.50 ug/mL-FEU  Basic metabolic panel     Status: Abnormal   Collection Time: 02/17/22  8:18 PM  Result Value Ref Range   Sodium 138 135 - 145 mmol/L   Potassium 3.8 3.5 - 5.1 mmol/L   Chloride 107 98 - 111 mmol/L   CO2 21 (L) 22 - 32 mmol/L   Glucose, Bld 129 (H) 70 - 99 mg/dL   BUN 10 6 - 20 mg/dL   Creatinine, Ser 0.68 0.44 - 1.00 mg/dL   Calcium 8.5 (L) 8.9 - 10.3 mg/dL   GFR, Estimated >60 >60 mL/min   Anion gap 10 5 - 15  CBC with Differential     Status: Abnormal   Collection Time: 02/17/22  8:18 PM  Result Value Ref Range   WBC 15.8 (H) 4.0 - 10.5 K/uL   RBC 4.05 3.87 - 5.11 MIL/uL   Hemoglobin 11.4 (L) 12.0 - 15.0 g/dL   HCT 35.4 (L) 36.0 - 46.0 %   MCV 87.4 80.0 - 100.0 fL   MCH 28.1 26.0 - 34.0 pg   MCHC 32.2 30.0 - 36.0 g/dL   RDW 12.9 11.5 - 15.5 %   Platelets 215 150 - 400 K/uL   nRBC 0.0 0.0 - 0.2 %   Neutrophils Relative % 83 %   Neutro Abs 13.2 (H) 1.7 - 7.7 K/uL   Lymphocytes Relative 11 %   Lymphs Abs 1.7 0.7 - 4.0 K/uL   Monocytes Relative 5 %   Monocytes Absolute 0.8 0.1 - 1.0 K/uL   Eosinophils Relative 0 %   Eosinophils Absolute 0.0 0.0 - 0.5 K/uL   Basophils Relative 0 %   Basophils Absolute 0.0 0.0 - 0.1 K/uL   Immature Granulocytes 1 %   Abs Immature Granulocytes 0.08 (H) 0.00 - 0.07 K/uL  Lactic acid, plasma     Status: None   Collection Time: 02/17/22  8:18 PM  Result Value Ref Range   Lactic Acid, Venous 1.5 0.5 - 1.9 mmol/L  ABO/Rh     Status: None   Collection Time: 02/17/22  8:18 PM  Result Value Ref Range   ABO/RH(D)      B POS Performed at [facility], [address] [place], [place] [address]   I-Stat beta hCG blood, ED     Status: None   Collection Time: 02/17/22  8:23 PM  Result Value Ref Range   I-stat hCG, quantitative <5.0 <5 mIU/mL   Comment 3          Urinalysis, Routine w reflex microscopic Urine, Clean Catch     Status: Abnormal   Collection Time: 02/18/22  12:09 AM  Result Value Ref Range   Color, Urine AMBER (A) YELLOW  APPearance HAZY (A) CLEAR   Specific Gravity, Urine 1.026 1.005 - 1.030   pH 5.0 5.0 - 8.0   Glucose, UA NEGATIVE NEGATIVE mg/dL   Hgb urine dipstick MODERATE (A) NEGATIVE   Bilirubin Urine NEGATIVE NEGATIVE   Ketones, ur 5 (A) NEGATIVE mg/dL   Protein, ur 161 (A) NEGATIVE mg/dL   Nitrite NEGATIVE NEGATIVE   Leukocytes,Ua TRACE (A) NEGATIVE   RBC / HPF >50 (H) 0 - 5 RBC/hpf   WBC, UA 21-50 0 - 5 WBC/hpf   Bacteria, UA RARE (A) NONE SEEN   Squamous Epithelial / LPF 6-10 0 - 5   Mucus PRESENT   Protime-INR     Status: Abnormal   Collection Time: 02/18/22 12:46 AM  Result Value Ref Range   Prothrombin Time 15.4 (H) 11.4 - 15.2 seconds   INR 1.2 0.8 - 1.2  Type and screen     Status: None   Collection Time: 02/18/22 12:46 AM  Result Value Ref Range   ABO/RH(D) B POS    Antibody Screen NEG    Sample Expiration      02/21/2022,2359 Performed at [facility], [address], [place], [place] [address]   Hemoglobin and hematocrit, blood     Status: Abnormal   Collection Time: 02/18/22  2:45 AM  Result Value Ref Range   Hemoglobin 10.2 (L) 12.0 - 15.0 g/dL   HCT 54.0 (L) 98.1 - 19.1 %    [place] ASCITES (ABDOMEN LIMITED)  Result Date: 02/18/2022 CLINICAL DATA:  Free fluid in the pelvis. Status post laparoscopic ovarian cystectomy for endometriosis. EXAM: LIMITED ABDOMEN ULTRASOUND FOR ASCITES TECHNIQUE: Limited ultrasound survey for ascites was performed in all four abdominal quadrants. COMPARISON:  02/18/2022. FINDINGS: No free fluid in the right upper quadrant, right lower quadrant, left lower quadrant and left upper quadrant. The pelvis was not specifically imaged on this exam. IMPRESSION: No evidence of ascites. Electronically Signed   By: Thornell Sartorius M.D.   On: 02/18/2022 04:26   CT Angio Chest/Abd/Pel for Dissection W and/or Wo Contrast  Addendum Date: 02/18/2022   ADDENDUM REPORT: 02/18/2022 01:46  ADDENDUM: Findings were reported to Dr. Marily Memos at 1:45 a.m. Electronically Signed   By: Thornell Sartorius M.D.   On: 02/18/2022 01:46   Result Date: 02/18/2022 CLINICAL DATA:  Acute aortic syndrome suspected. Worsening stomach pain, nausea, and shortness of breath. EXAM: CT ANGIOGRAPHY CHEST, ABDOMEN AND PELVIS TECHNIQUE: Non-contrast CT of the chest was initially obtained. Multidetector CT imaging through the chest, abdomen and pelvis was performed using the standard protocol during bolus administration of intravenous contrast. Multiplanar reconstructed images and MIPs were obtained and reviewed to evaluate the vascular anatomy. RADIATION DOSE REDUCTION: This exam was performed according to the departmental dose-optimization program which includes automated exposure control, adjustment of the mA and/or kV according to patient size and/or use of iterative reconstruction technique. CONTRAST:  OMNIPAQUE IOHEXOL 350 MG/ML SOLN COMPARISON:  08/03/2012. FINDINGS: CTA CHEST FINDINGS Cardiovascular: The heart is normal in size and there is no pericardial effusion. The aorta is without evidence of aneurysm or dissection. The pulmonary trunk is normal in caliber. Mediastinum/Nodes: No mediastinal, hilar, or axillary lymphadenopathy. The thyroid gland, trachea, and esophagus are within normal limits. Lungs/Pleura: Strandy opacities are noted in the lungs bilaterally and there is consolidation in the medial aspect of the right lower lobe. No effusion or pneumothorax. Musculoskeletal: No acute osseous abnormality. Review of the MIP images confirms the above findings. CTA ABDOMEN AND PELVIS  FINDINGS VASCULAR Aorta: No evidence of aortic aneurysm or dissection, however exam is extremely limited due to patient's body habitus. Celiac: Patent. SMA: Patent. Renals: Patent.  Accessory renal artery on the right. IMA: Proximal IMA is patent.  The mid to distal IMA is not seen. Inflow: Patent. Veins: No obvious venous  abnormality within the limitations of this arterial phase study. Review of the MIP images confirms the above findings. NON-VASCULAR Hepatobiliary: No focal liver abnormality is seen. Status post cholecystectomy. No biliary dilatation. Pancreas: Unremarkable. No pancreatic ductal dilatation or surrounding inflammatory changes. Spleen: Normal in size without focal abnormality. Adrenals/Urinary Tract: Adrenal glands are unremarkable. Kidneys are normal, without renal calculi, focal lesion, or hydronephrosis. Air is present in the urinary bladder which may be iatrogenic. Stomach/Bowel: Stomach is within normal limits. Appendix is not seen. No evidence of bowel wall thickening, distention, or inflammatory changes. Examination is limited due to lack of oral contrast. A ventral abdominal wall hernia is noted in the right lower quadrant containing nonobstructed bowel. Lymphatic: No abdominal or pelvic lymphadenopathy Reproductive: The uterus is not well delineated on exam. No obvious adnexal mass. Other: Examination is extremely limited due to patient's body habitus. Small amount of hyperdense free fluid in the pelvis, possible blood products. Multiple foci of free air are noted in the pelvis and at the porta hepatis which may be due to history of recent surgery. Musculoskeletal: Subcutaneous emphysema is noted in the anterior abdominopelvic wall and upper thighs. No acute osseous abnormality. Review of the MIP images confirms the above findings. IMPRESSION: 1. Free air in the abdomen and pelvis and subcutaneous emphysema, may be related to history of recent surgery. Clinical correlation is recommended. 2. No evidence of aortic aneurysm or dissection. Examination is extremely limited due to patient's body habitus. 3. Small amount of hyperdense free fluid in the pelvis, possible blood products. 4. Scattered airspace disease in the lungs bilaterally with right lower lobe consolidation. 5. Right lower quadrant ventral  abdominal wall hernia containing nonobstructed small bowel. Electronically Signed: By: Thornell Sartorius M.D. On: 02/18/2022 01:39   DG Chest 2 View  Result Date: 02/17/2022 CLINICAL DATA:  Short of breath EXAM: CHEST - 2 VIEW COMPARISON:  Chest two-view 01/27/2007 FINDINGS: Hypoventilation. Decreased lung volume with bibasilar atelectasis. No infiltrate or effusion. Vascularity normal. IMPRESSION: Hypoventilation with bibasilar atelectasis. Electronically Signed   By: Marlan Palau M.D.   On: 02/17/2022 19:37    Assessment/Plan: 31 y.o. female POD#2 s/p laparoscopic ovarian left cystectomy, chromotubation, and lysis of adhesions Hospital Problem List as of 02/18/2022          Priority Resolved POA     Unprioritized     * (Principal) Post-op pain   Yes     Nausea   Unknown     Pneumonia   Unknown     Postoperative atelectasis   Unknown     Postoperative anemia due to acute blood loss   Unknown     Obese   Unknown    Admit for postop pain and nausea control Serial abdominal exams and CBC to evaluate hemoglobin N.p.o. until hemoglobin stabilizes Continue azithromycin for suspected pneumonia   Jackie Plum 02/18/2022, 6:51 AM

## 2022-02-18 NOTE — ED Notes (Signed)
Patient transported to Ultrasound 

## 2022-02-18 NOTE — ED Notes (Signed)
Pt appears flushed in face. Pt denies pain or being hot. RN checked temperature 98.3 Oral

## 2022-02-18 NOTE — Plan of Care (Signed)

## 2022-02-18 NOTE — ED Notes (Addendum)
Phlebotomy was unsuccessful with attempt  Made Attending aware

## 2022-02-19 ENCOUNTER — Inpatient Hospital Stay (HOSPITAL_COMMUNITY): Payer: BC Managed Care – PPO | Admitting: Anesthesiology

## 2022-02-19 ENCOUNTER — Other Ambulatory Visit: Payer: Self-pay

## 2022-02-19 ENCOUNTER — Encounter (HOSPITAL_COMMUNITY): Payer: Self-pay | Admitting: Obstetrics and Gynecology

## 2022-02-19 ENCOUNTER — Encounter (HOSPITAL_COMMUNITY)
Admission: EM | Disposition: A | Discharge status: Home / Self Care | Payer: Self-pay | Source: Home / Self Care | Attending: Obstetrics and Gynecology

## 2022-02-19 ENCOUNTER — Inpatient Hospital Stay (HOSPITAL_COMMUNITY): Payer: BC Managed Care – PPO

## 2022-02-19 HISTORY — PX: LAPAROSCOPY: SHX197

## 2022-02-19 HISTORY — PX: LAPAROTOMY: SHX154

## 2022-02-19 HISTORY — PX: INCISIONAL HERNIA REPAIR: SHX193

## 2022-02-19 LAB — BASIC METABOLIC PANEL WITH GFR
Anion gap: 10 (ref 5–15)
BUN: 15 mg/dL (ref 6–20)
CO2: 18 mmol/L — ABNORMAL LOW (ref 22–32)
Calcium: 8.5 mg/dL — ABNORMAL LOW (ref 8.9–10.3)
Chloride: 111 mmol/L (ref 98–111)
Creatinine, Ser: 0.64 mg/dL (ref 0.44–1.00)
GFR, Estimated: >60 mL/min
Glucose, Bld: 109 mg/dL — ABNORMAL HIGH (ref 70–99)
Potassium: 3.6 mmol/L (ref 3.5–5.1)
Sodium: 139 mmol/L (ref 135–145)

## 2022-02-19 LAB — CBC WITH DIFFERENTIAL/PLATELET
Abs Immature Granulocytes: 0.07 K/uL (ref 0.00–0.07)
Basophils Absolute: 0 K/uL (ref 0.0–0.1)
Basophils Relative: 0 %
Eosinophils Absolute: 0.1 K/uL (ref 0.0–0.5)
Eosinophils Relative: 1 %
HCT: 30.6 % — ABNORMAL LOW (ref 36.0–46.0)
Hemoglobin: 10.1 g/dL — ABNORMAL LOW (ref 12.0–15.0)
Immature Granulocytes: 1 %
Lymphocytes Relative: 16 %
Lymphs Abs: 1.7 K/uL (ref 0.7–4.0)
MCH: 28.1 pg (ref 26.0–34.0)
MCHC: 33 g/dL (ref 30.0–36.0)
MCV: 85 fL (ref 80.0–100.0)
Monocytes Absolute: 0.5 K/uL (ref 0.1–1.0)
Monocytes Relative: 5 %
Neutro Abs: 8.1 K/uL — ABNORMAL HIGH (ref 1.7–7.7)
Neutrophils Relative %: 77 %
Platelets: 220 K/uL (ref 150–400)
RBC: 3.6 MIL/uL — ABNORMAL LOW (ref 3.87–5.11)
RDW: 13.2 % (ref 11.5–15.5)
WBC: 10.5 K/uL (ref 4.0–10.5)
nRBC: 0 % (ref 0.0–0.2)

## 2022-02-19 LAB — GROUP A STREP BY PCR: Group A Strep by PCR: NOT DETECTED

## 2022-02-19 LAB — SURGICAL PATHOLOGY

## 2022-02-19 SURGERY — LAPAROSCOPY OPERATIVE
Anesthesia: General

## 2022-02-19 MED ORDER — BUPIVACAINE HCL (PF) 0.25 % IJ SOLN
INTRAMUSCULAR | Status: AC
  Filled 2022-02-19: qty 30

## 2022-02-19 MED ORDER — BUPIVACAINE HCL (PF) 0.25 % IJ SOLN
INTRAMUSCULAR | Status: DC | PRN
  Administered 2022-02-19: 16 mL

## 2022-02-19 MED ORDER — ROCURONIUM BROMIDE 100 MG/10ML IV SOLN: INTRAVENOUS | Status: DC | PRN

## 2022-02-19 MED ORDER — MIDAZOLAM HCL 2 MG/2ML IJ SOLN
INTRAMUSCULAR | Status: DC | PRN
  Administered 2022-02-19: 2 mg via INTRAVENOUS

## 2022-02-19 MED ORDER — LIDOCAINE 2% (20 MG/ML) 5 ML SYRINGE
INTRAMUSCULAR | Status: AC
  Filled 2022-02-19: qty 5

## 2022-02-19 MED ORDER — SODIUM CHLORIDE 0.9 % IR SOLN
Status: DC | PRN
  Administered 2022-02-19: 3000 mL

## 2022-02-19 MED ORDER — CALCIUM CARBONATE ANTACID 500 MG PO CHEW
1.0000 | CHEWABLE_TABLET | ORAL | Status: DC | PRN
  Administered 2022-02-19: 200 mg via ORAL
  Filled 2022-02-19 (×2): qty 1

## 2022-02-19 MED ORDER — DEXAMETHASONE SODIUM PHOSPHATE 10 MG/ML IJ SOLN
INTRAMUSCULAR | Status: AC
  Filled 2022-02-19: qty 1

## 2022-02-19 MED ORDER — ONDANSETRON HCL 4 MG/2ML IJ SOLN
INTRAMUSCULAR | Status: DC | PRN
  Administered 2022-02-19: 4 mg via INTRAVENOUS

## 2022-02-19 MED ORDER — ENOXAPARIN SODIUM 40 MG/0.4ML IJ SOSY
40.0000 mg | PREFILLED_SYRINGE | INTRAMUSCULAR | Status: DC
  Administered 2022-02-20: 40 mg via SUBCUTANEOUS
  Filled 2022-02-19: qty 0.4

## 2022-02-19 MED ORDER — CHLORHEXIDINE GLUCONATE 0.12 % MT SOLN
OROMUCOSAL | Status: AC
  Filled 2022-02-19: qty 15

## 2022-02-19 MED ORDER — FENTANYL CITRATE (PF) 100 MCG/2ML IJ SOLN
25.0000 ug | INTRAMUSCULAR | Status: DC | PRN
  Administered 2022-02-19: 50 ug via INTRAVENOUS

## 2022-02-19 MED ORDER — KETOROLAC TROMETHAMINE 30 MG/ML IJ SOLN
30.0000 mg | Freq: Four times a day (QID) | INTRAMUSCULAR | Status: DC
  Administered 2022-02-20 (×3): 30 mg via INTRAVENOUS
  Filled 2022-02-19 (×3): qty 1

## 2022-02-19 MED ORDER — PROPOFOL 10 MG/ML IV BOLUS
INTRAVENOUS | Status: DC | PRN
  Administered 2022-02-19: 200 mg via INTRAVENOUS

## 2022-02-19 MED ORDER — LACTATED RINGERS IV SOLN: INTRAVENOUS | Status: DC

## 2022-02-19 MED ORDER — ONDANSETRON HCL 4 MG/2ML IJ SOLN: 4.0000 mg | Freq: Four times a day (QID) | INTRAMUSCULAR | Status: DC | PRN

## 2022-02-19 MED ORDER — ORAL CARE MOUTH RINSE: 15.0000 mL | Freq: Once | OROMUCOSAL | Status: DC

## 2022-02-19 MED ORDER — ACETAMINOPHEN 500 MG PO TABS
1000.0000 mg | ORAL_TABLET | Freq: Four times a day (QID) | ORAL | Status: DC
  Administered 2022-02-20 (×2): 1000 mg via ORAL
  Filled 2022-02-19 (×2): qty 2

## 2022-02-19 MED ORDER — SUCCINYLCHOLINE CHLORIDE 200 MG/10ML IV SOSY
PREFILLED_SYRINGE | INTRAVENOUS | Status: AC
  Filled 2022-02-19: qty 10

## 2022-02-19 MED ORDER — ONDANSETRON HCL 4 MG/2ML IJ SOLN
INTRAMUSCULAR | Status: AC
  Filled 2022-02-19: qty 2

## 2022-02-19 MED ORDER — AMISULPRIDE (ANTIEMETIC) 5 MG/2ML IV SOLN: 10.0000 mg | Freq: Once | INTRAVENOUS | Status: DC | PRN

## 2022-02-19 MED ORDER — SUCCINYLCHOLINE CHLORIDE 200 MG/10ML IV SOSY
PREFILLED_SYRINGE | INTRAVENOUS | Status: DC | PRN
  Administered 2022-02-19: 160 mg via INTRAVENOUS

## 2022-02-19 MED ORDER — BISACODYL 10 MG RE SUPP
10.0000 mg | Freq: Once | RECTAL | Status: AC
  Administered 2022-02-19: 10 mg via RECTAL
  Filled 2022-02-19: qty 1

## 2022-02-19 MED ORDER — ROCURONIUM BROMIDE 10 MG/ML (PF) SYRINGE
PREFILLED_SYRINGE | INTRAVENOUS | Status: DC | PRN
  Administered 2022-02-19 (×2): 20 mg via INTRAVENOUS
  Administered 2022-02-19: 60 mg via INTRAVENOUS

## 2022-02-19 MED ORDER — HYDROMORPHONE HCL 1 MG/ML IJ SOLN: 1.0000 mg | INTRAMUSCULAR | Status: DC | PRN

## 2022-02-19 MED ORDER — ONDANSETRON HCL 4 MG PO TABS: 4.0000 mg | ORAL_TABLET | Freq: Four times a day (QID) | ORAL | Status: DC | PRN

## 2022-02-19 MED ORDER — GUAIFENESIN-DM 100-10 MG/5ML PO SYRP
5.0000 mL | ORAL_SOLUTION | Freq: Three times a day (TID) | ORAL | Status: DC
  Administered 2022-02-20 (×2): 5 mL via ORAL
  Filled 2022-02-19 (×3): qty 5

## 2022-02-19 MED ORDER — FENTANYL CITRATE (PF) 250 MCG/5ML IJ SOLN
INTRAMUSCULAR | Status: DC | PRN
  Administered 2022-02-19: 100 ug via INTRAVENOUS
  Administered 2022-02-19 (×3): 50 ug via INTRAVENOUS

## 2022-02-19 MED ORDER — METOCLOPRAMIDE HCL 5 MG/ML IJ SOLN
10.0000 mg | Freq: Four times a day (QID) | INTRAMUSCULAR | Status: DC
  Administered 2022-02-19 – 2022-02-20 (×3): 10 mg via INTRAVENOUS
  Filled 2022-02-19 (×3): qty 2

## 2022-02-19 MED ORDER — PROPOFOL 10 MG/ML IV BOLUS
INTRAVENOUS | Status: AC
  Filled 2022-02-19: qty 20

## 2022-02-19 MED ORDER — CHLORHEXIDINE GLUCONATE 0.12 % MT SOLN: 15.0000 mL | Freq: Once | OROMUCOSAL | Status: DC

## 2022-02-19 MED ORDER — IBUPROFEN 600 MG PO TABS: 600.0000 mg | ORAL_TABLET | Freq: Four times a day (QID) | ORAL | Status: DC

## 2022-02-19 MED ORDER — PHENYLEPHRINE 80 MCG/ML (10ML) SYRINGE FOR IV PUSH (FOR BLOOD PRESSURE SUPPORT)
PREFILLED_SYRINGE | INTRAVENOUS | Status: AC
  Filled 2022-02-19: qty 10

## 2022-02-19 MED ORDER — SUGAMMADEX SODIUM 200 MG/2ML IV SOLN
INTRAVENOUS | Status: DC | PRN
  Administered 2022-02-19 (×3): 100 mg via INTRAVENOUS

## 2022-02-19 MED ORDER — FAMOTIDINE IN NACL 20-0.9 MG/50ML-% IV SOLN
20.0000 mg | Freq: Two times a day (BID) | INTRAVENOUS | Status: DC
  Administered 2022-02-19 – 2022-02-20 (×2): 20 mg via INTRAVENOUS
  Filled 2022-02-19 (×5): qty 50

## 2022-02-19 MED ORDER — OXYCODONE HCL 5 MG PO TABS: 5.0000 mg | ORAL_TABLET | ORAL | Status: DC | PRN

## 2022-02-19 MED ORDER — PHENYLEPHRINE 80 MCG/ML (10ML) SYRINGE FOR IV PUSH (FOR BLOOD PRESSURE SUPPORT)
PREFILLED_SYRINGE | INTRAVENOUS | Status: DC | PRN
  Administered 2022-02-19 (×4): 80 ug via INTRAVENOUS
  Administered 2022-02-19: 160 ug via INTRAVENOUS

## 2022-02-19 MED ORDER — ROCURONIUM BROMIDE 10 MG/ML (PF) SYRINGE
PREFILLED_SYRINGE | INTRAVENOUS | Status: AC
  Filled 2022-02-19: qty 10

## 2022-02-19 MED ORDER — MAGNESIUM CITRATE PO SOLN
1.0000 | Freq: Once | ORAL | Status: AC
  Administered 2022-02-19: 1 via ORAL
  Filled 2022-02-19: qty 296

## 2022-02-19 MED ORDER — FENTANYL CITRATE (PF) 250 MCG/5ML IJ SOLN
INTRAMUSCULAR | Status: AC
  Filled 2022-02-19: qty 5

## 2022-02-19 MED ORDER — ENOXAPARIN SODIUM 40 MG/0.4ML IJ SOSY: 40.0000 mg | PREFILLED_SYRINGE | INTRAMUSCULAR | Status: DC

## 2022-02-19 MED ORDER — DEXAMETHASONE SODIUM PHOSPHATE 10 MG/ML IJ SOLN
INTRAMUSCULAR | Status: DC | PRN
  Administered 2022-02-19: 5 mg via INTRAVENOUS

## 2022-02-19 MED ORDER — MIDAZOLAM HCL 2 MG/2ML IJ SOLN
INTRAMUSCULAR | Status: AC
  Filled 2022-02-19: qty 2

## 2022-02-19 MED ORDER — FENTANYL CITRATE (PF) 100 MCG/2ML IJ SOLN
INTRAMUSCULAR | Status: AC
  Filled 2022-02-19: qty 2

## 2022-02-19 MED ORDER — LIDOCAINE 2% (20 MG/ML) 5 ML SYRINGE
INTRAMUSCULAR | Status: DC | PRN
  Administered 2022-02-19: 80 mg via INTRAVENOUS

## 2022-02-19 SURGICAL SUPPLY — 85 items
ADH SKN CLS APL DERMABOND .7 (GAUZE/BANDAGES/DRESSINGS) ×2
BAG COUNTER SPONGE SURGICOUNT (BAG) ×3 IMPLANT
BAG SPEC RTRVL LRG 6X4 10 (ENDOMECHANICALS)
BAG SPNG CNTER NS LX DISP (BAG) ×2
BARRIER ADHS 3X4 INTERCEED (GAUZE/BANDAGES/DRESSINGS) IMPLANT
BLADE SURG 10 STRL SS (BLADE) ×6 IMPLANT
BRR ADH 4X3 ABS CNTRL BYND (GAUZE/BANDAGES/DRESSINGS)
CABLE HIGH FREQUENCY MONO STRZ (ELECTRODE) IMPLANT
CANISTER SUCT 3000ML PPV (MISCELLANEOUS) ×3 IMPLANT
CLSR STERI-STRIP ANTIMIC 1/2X4 (GAUZE/BANDAGES/DRESSINGS) IMPLANT
DERMABOND ADVANCED .7 DNX12 (GAUZE/BANDAGES/DRESSINGS) ×3 IMPLANT
DISSECTOR SPONGE CHERRY (GAUZE/BANDAGES/DRESSINGS) IMPLANT
DRAPE WARM FLUID 44X44 (DRAPES) IMPLANT
DRSG OPSITE POSTOP 3X4 (GAUZE/BANDAGES/DRESSINGS) IMPLANT
DRSG OPSITE POSTOP 4X8 (GAUZE/BANDAGES/DRESSINGS) IMPLANT
DURAPREP 26ML APPLICATOR (WOUND CARE) ×6 IMPLANT
GAUZE 4X4 16PLY ~~LOC~~+RFID DBL (SPONGE) IMPLANT
GLOVE BIO SURGEON STRL SZ7.5 (GLOVE) ×3 IMPLANT
GLOVE BIOGEL PI IND STRL 7.0 (GLOVE) ×6 IMPLANT
GLOVE BIOGEL PI IND STRL 7.5 (GLOVE) ×3 IMPLANT
GLOVE SURG ENC MOIS LTX SZ7.5 (GLOVE) ×3 IMPLANT
GLOVE SURG UNDER LTX SZ7.5 (GLOVE) ×6 IMPLANT
GOWN STRL REUS W/ TWL LRG LVL3 (GOWN DISPOSABLE) ×6 IMPLANT
GOWN STRL REUS W/TWL LRG LVL3 (GOWN DISPOSABLE)
HEMOSTAT SURGICEL 4X8 (HEMOSTASIS) IMPLANT
HIBICLENS CHG 4% 4OZ BTL (MISCELLANEOUS) ×3 IMPLANT
IRRIG SUCT STRYKERFLOW 2 WTIP (MISCELLANEOUS)
IRRIGATION SUCT STRKRFLW 2 WTP (MISCELLANEOUS) IMPLANT
KIT TURNOVER KIT B (KITS) ×3 IMPLANT
LIGASURE VESSEL 5MM BLUNT TIP (ELECTROSURGICAL) IMPLANT
MESH VICRYL KNITTED 12X12 (Mesh General) IMPLANT
NDL INSUFFLATION 14GA 120MM (NEEDLE) ×3 IMPLANT
NDL SPNL 18GX3.5 QUINCKE PK (NEEDLE) IMPLANT
NDL SPNL 22GX3.5 QUINCKE BK (NEEDLE) IMPLANT
NEEDLE HYPO 22GX1.5 SAFETY (NEEDLE) IMPLANT
NEEDLE INSUFFLATION 14GA 120MM (NEEDLE) IMPLANT
NEEDLE SPNL 18GX3.5 QUINCKE PK (NEEDLE) ×2 IMPLANT
NEEDLE SPNL 22GX3.5 QUINCKE BK (NEEDLE) ×2 IMPLANT
NS IRRIG 1000ML POUR BTL (IV SOLUTION) ×3 IMPLANT
PACK ABDOMINAL GYN (CUSTOM PROCEDURE TRAY) ×3 IMPLANT
PACK LAPAROSCOPY BASIN (CUSTOM PROCEDURE TRAY) ×3 IMPLANT
PACK TRENDGUARD 450 HYBRID PRO (MISCELLANEOUS) IMPLANT
PAD OB MATERNITY 4.3X12.25 (PERSONAL CARE ITEMS) ×3 IMPLANT
PENCIL BUTTON HOLSTER BLD 10FT (ELECTRODE) ×3 IMPLANT
POUCH SPECIMEN RETRIEVAL 10MM (ENDOMECHANICALS) IMPLANT
PROTECTOR NERVE ULNAR (MISCELLANEOUS) ×6 IMPLANT
SCISSORS LAP 5X35 DISP (ENDOMECHANICALS) IMPLANT
SET TUBE SMOKE EVAC HIGH FLOW (TUBING) ×3 IMPLANT
SLEEVE ENDOPATH XCEL 5M (ENDOMECHANICALS) ×3 IMPLANT
SPIKE FLUID TRANSFER (MISCELLANEOUS) IMPLANT
SPONGE T-LAP 18X18 ~~LOC~~+RFID (SPONGE) ×3 IMPLANT
STAPLER VISISTAT 35W (STAPLE) IMPLANT
SUT CHROMIC 2 0 CT 1 (SUTURE) ×3 IMPLANT
SUT CHROMIC 2 0 SH (SUTURE) IMPLANT
SUT MNCRL AB 3-0 PS2 18 (SUTURE) IMPLANT
SUT MNCRL AB 3-0 PS2 27 (SUTURE) ×6 IMPLANT
SUT MON AB 3-0 SH 27 (SUTURE)
SUT MON AB 3-0 SH27 (SUTURE) IMPLANT
SUT PDS AB 1 CT  36 (SUTURE) ×4
SUT PDS AB 1 CT 36 (SUTURE) IMPLANT
SUT PDS AB 1 CT1 36 (SUTURE) IMPLANT
SUT PDS AB 1 CTX 36 (SUTURE) IMPLANT
SUT PLAIN 2 0 (SUTURE) ×2
SUT PLAIN 2 0 XLH (SUTURE) ×3 IMPLANT
SUT PLAIN ABS 2-0 CT1 27XMFL (SUTURE) IMPLANT
SUT VIC AB 0 CT1 18XCR BRD8 (SUTURE) IMPLANT
SUT VIC AB 0 CT1 27 (SUTURE)
SUT VIC AB 0 CT1 27XBRD ANBCTR (SUTURE) ×12 IMPLANT
SUT VIC AB 0 CT1 8-18 (SUTURE)
SUT VICRYL 0 ENDOLOOP (SUTURE) IMPLANT
SUT VICRYL 0 TIES 12 18 (SUTURE) ×3 IMPLANT
SUT VICRYL 0 UR6 27IN ABS (SUTURE) ×3 IMPLANT
SYR 50ML LL SCALE MARK (SYRINGE) IMPLANT
SYR CONTROL 10ML LL (SYRINGE) IMPLANT
SYSTEM CARTER THOMASON II (TROCAR) IMPLANT
TOWEL GREEN STERILE FF (TOWEL DISPOSABLE) ×6 IMPLANT
TRAY FOLEY W/BAG SLVR 14FR (SET/KITS/TRAYS/PACK) ×3 IMPLANT
TRENDGUARD 450 HYBRID PRO PACK (MISCELLANEOUS) ×2
TROCAR BALLN GELPORT 12X130M (ENDOMECHANICALS) IMPLANT
TROCAR XCEL NON-BLD 11X100MML (ENDOMECHANICALS) ×3 IMPLANT
TROCAR XCEL NON-BLD 5MMX100MML (ENDOMECHANICALS) ×3 IMPLANT
TROCAR Z THREAD OPTICAL 5X150 (TROCAR) IMPLANT
TUBE CONNECTING 20X1/4 (TUBING) IMPLANT
WARMER LAPAROSCOPE (MISCELLANEOUS) ×3 IMPLANT
YANKAUER SUCT BULB TIP NO VENT (SUCTIONS) IMPLANT

## 2022-02-19 NOTE — Progress Notes (Signed)
Subjective: Patient reports flatus twice today.  Not eating much of her clears for lack of appetite.  S/p bottle of Mg citrate that she threw up.    Objective: I have reviewed patient's vital signs.  General: alert and no distress Resp: decreased at bases Cardio: regular rate and rhythm GI: decreased bowel sounds, minimal tenderness, no rebound or guarding, incisions c/d/dermabond in place Extremities: no calf tenderness Vaginal Bleeding: minimal   Assessment/Plan: POD #3 readmitted with pneumonia which is improving.  WBC count has normalized.  Hgb stable.  KUB - ileus vs partial SBO.  Will make NPO.  Will order general surgery consult for plan to return to OR for dx lap and repair of hernia. Dulcolax supp Encourage IS  Encourage OOB Voiding without difficulty    LOS: 1 day    Delice Lesch, MD 02/19/2022, 2:14 PM  Spoke with Dr. Annye English and he will join me in OR.  Case added on.

## 2022-02-19 NOTE — Anesthesia Procedure Notes (Signed)
Procedure Name: Intubation Date/Time: 02/19/2022 5:45 PM  Performed by: Trinna Post., CRNAPre-anesthesia Checklist: Patient identified, Emergency Drugs available, Suction available, Patient being monitored and Timeout performed Patient Re-evaluated:Patient Re-evaluated prior to induction Oxygen Delivery Method: Circle system utilized Preoxygenation: Pre-oxygenation with 100% oxygen Induction Type: IV induction, Rapid sequence and Cricoid Pressure applied Laryngoscope Size: Mac and 3 Grade View: Grade I Tube type: Oral Tube size: 7.0 mm Number of attempts: 1 Airway Equipment and Method: Stylet Placement Confirmation: ETT inserted through vocal cords under direct vision, positive ETCO2 and breath sounds checked- equal and bilateral Secured at: 22 cm Tube secured with: Tape Dental Injury: Teeth and Oropharynx as per pre-operative assessment

## 2022-02-19 NOTE — Plan of Care (Signed)
  Problem: Education: Goal: Knowledge of General Education information will improve Description: Including pain rating scale, medication(s)/side effects and non-pharmacologic comfort measures Outcome: Progressing   Problem: Health Behavior/Discharge Planning: Goal: Ability to manage health-related needs will improve Outcome: Progressing   Problem: Clinical Measurements: Goal: Diagnostic test results will improve Outcome: Progressing   

## 2022-02-19 NOTE — Anesthesia Preprocedure Evaluation (Addendum)
Anesthesia Evaluation  Patient identified by MRN, date of birth, ID band Patient awake    Reviewed: Allergy & Precautions, NPO status , Patient's Chart, lab work & pertinent test results  Airway Mallampati: III  TM Distance: >3 FB Neck ROM: Full    Dental no notable dental hx.    Pulmonary Current Smoker,    Pulmonary exam normal        Cardiovascular negative cardio ROS   Rhythm:Regular Rate:Normal     Neuro/Psych Anxiety    GI/Hepatic Neg liver ROS, GERD  ,S/p ovarian cystectomy with hernia    Endo/Other  Morbid obesity  Renal/GU negative Renal ROS  negative genitourinary   Musculoskeletal negative musculoskeletal ROS (+)   Abdominal Normal abdominal exam  (+)   Peds  Hematology  (+) Blood dyscrasia, anemia ,   Anesthesia Other Findings   Reproductive/Obstetrics                            Anesthesia Physical Anesthesia Plan  ASA: 3 and emergent  Anesthesia Plan: General   Post-op Pain Management:    Induction: Intravenous and Rapid sequence  PONV Risk Score and Plan: 2 and Ondansetron, Dexamethasone, Midazolam and Treatment may vary due to age or medical condition  Airway Management Planned: Mask and Oral ETT  Additional Equipment: None  Intra-op Plan:   Post-operative Plan: Extubation in OR  Informed Consent: I have reviewed the patients History and Physical, chart, labs and discussed the procedure including the risks, benefits and alternatives for the proposed anesthesia with the patient or authorized representative who has indicated his/her understanding and acceptance.     Dental advisory given  Plan Discussed with: CRNA  Anesthesia Plan Comments:        Anesthesia Quick Evaluation

## 2022-02-19 NOTE — Progress Notes (Signed)
Returned to room 6n16 from PACU, family at bedside. Denies nausea/pain at this time. Will continue to monitor.

## 2022-02-19 NOTE — Transfer of Care (Signed)
Immediate Anesthesia Transfer of Care Note  Patient: [name]  Procedure(s) Performed: LAPAROSCOPY OPERATIVE EXPLORATORY LAPAROTOMY HERNIA REPAIR INCISIONAL X2  Patient Location: PACU  Anesthesia Type:General  Level of Consciousness: awake, alert  and oriented  Airway & Oxygen Therapy: Patient Spontanous Breathing and Patient connected to face mask oxygen  Post-op Assessment: Report given to RN and Post -op Vital signs reviewed and stable  Post vital signs: Reviewed and stable  Last Vitals:  Vitals Value Taken Time  BP 114/82 02/19/22 2020  Temp 36.7 C 02/19/22 2020  Pulse 93 02/19/22 2030  Resp 19 02/19/22 2030  SpO2 94 % 02/19/22 2030  Vitals shown include unvalidated device data.  Last Pain:  Vitals:   02/19/22 2020  TempSrc:   PainSc: Asleep      Patients Stated Pain Goal: 0 (75/91/63 8466)  Complications: No notable events documented.

## 2022-02-19 NOTE — Op Note (Signed)
Patient: [name] ([birth year], [id number])  Date of Surgery: 02/19/22  Preoperative Diagnosis: Early incisional hernia with incarcerated bowel and small bowel obstruction  Postoperative Diagnosis: Early incisional hernia with incarcerated bowel and small bowel obstruction   Surgical Procedure: LAPAROSCOPY OPERATIVE: 49320 (CPT) EXPLORATORY LAPAROTOMY: 49000 (CPT) HERNIA REPAIR INCISIONAL X2:    Operative Team Members:  Surgeon(s) and Role:    * Everett Graff, MD - Primary - OBGYN    * White, Sharon Mt, MD - Primary - General Surgery    * Amrita Radu, Nickola Major, MD - Assisting - General Surgery  Anesthesiologist: Darral Dash, DO CRNA: Trinna Post., CRNA   Anesthesia: General   Fluids:  Total I/O In: 1000 [I.V.:1000] Out: 200 [Urine:150; IRJJO:84]  Complications: None  Drains:  none   Specimen:  ID Type Source Tests Collected by Time Destination  A : Swab of Tonsils ENT PATH Other GROUP A STREP BY PCR Everett Graff, MD 02/19/2022 1759      Disposition:  PACU - hemodynamically stable.  Plan of Care: Per OBGYN    Indications for Procedure: [name] is a 31 y.o. female who presented with early post op fascial dehiscence with incisional hernia and small bowel obstruction after pelvic surgery.  Dr. Mancel Bale took the patient back to the operating room and asked the general surgery team for an intra-operative consultation.  Dr. Dema Severin responded, and at shift change I joined to assist in completing the case.  Findings: Fascial dehiscence of pfannensteil incision and remote right lower quadrant dehiscense - possibly from nearby 83mm port site incision.   Description of Procedure:   The case been started by Dr. Mancel Bale when general surgery entered the room.  Please see her operative note for more details.  On laparoscopic evaluation in the operating room, the Pfannenstiel incision had appeared to dehisce at the fascial level.  The skin  of the Pfannenstiel incision was opened and the Pfannenstiel incision was closed using two 0 PDS suture in running fashion.  On laparoscopic inspection of the abdominal wall, there was still a hernia to the right of the Pfannenstiel incision.  This was in the area of a 5 mm port site and may have been due to a port site hernia, as the defect appeared to be in between the rectus muscle and the lateral abdominal wall musculature.  The skin and subcutaneous tissue incision from the Pfannenstiel incision was able to be extended laterally and the fascial defect exposed.  Multiple 0 PDS sutures were placed in figure-of-eight fashion to close this fascial defect.  On laparoscopic inspection the fascia appeared adequately closed so we proceeded with closure.  The Hassan incision was closed at the fascial level with a figure of 8-0 PDS suture.  The 5 mm trocar in the left lower quadrant was removed under direct vision.  The skin was closed using Monocryl the port sites and chromic in the subtenons layer and Monocryl at the subcuticular layer for the Pfannenstiel incision.  Steri-Strips were applied with a sterile dressing to the Pfannenstiel incision.  Dermabond was applied to the port sites.  All sponge needle counts were correct at the end of this case.  Please see Dr. Mancel Bale' dictation for more details.  At the end of the case we reviewed the infection status of the case. Patient: [name] Patient Case: Emergent Infection Present At Time Of Surgery (PATOS): None  Louanna Raw, MD General, Bariatric, & Minimally Invasive Surgery [facility]   ADDENDUM I scrubbed in after laparoscopic access to the abdomen was obtained. In the RLQ an evident bowel containing 'hernia' is evident. With gentle traction, the involved loop of small bowel was reduced.  This is pink and well-perfused in appearance.  We then evaluated the Pfannenstiel incision.  There is  evident separation of the skin and some drainage.  With gentle blunt probing using my index finger, were able to see there is also fascial disruption and this communicates with the abdominal cavity.  At least a portion of this hernia is related to disruption of the Pfannenstiel.  We therefore opted to open the skin further to facilitate visualization.  We attempted to close the peritoneum using a running 0 Vicryl suture.  The anterior rectus sheath is then closed using 2 running #1 PDS sutures.  There is tearing of the muscle at this location consistent with some sort of traumatic separation.  We also ultimately further extended the Pfannenstiel to the right to facilitate access to the separate area where there is evident herniation.  At this location, we suspect this was likely related to the 5 mm trocar site having been enlarged as well.  As noted above, this was also closed primarily.  This was done using #1 PDS sutures in figure-of-eight manners as noted above.    Marin Olp, MD [facility]

## 2022-02-20 LAB — BASIC METABOLIC PANEL
Anion gap: 7 (ref 5–15)
BUN: 12 mg/dL (ref 6–20)
CO2: 21 mmol/L — ABNORMAL LOW (ref 22–32)
Calcium: 8 mg/dL — ABNORMAL LOW (ref 8.9–10.3)
Chloride: 111 mmol/L (ref 98–111)
Creatinine, Ser: 0.63 mg/dL (ref 0.44–1.00)
GFR, Estimated: >60 mL/min (ref >60)
Glucose, Bld: 101 mg/dL — ABNORMAL HIGH (ref 70–99)
Potassium: 4.1 mmol/L (ref 3.5–5.1)
Sodium: 139 mmol/L (ref 135–145)

## 2022-02-20 LAB — CBC
HCT: 29 % — ABNORMAL LOW (ref 36.0–46.0)
Hemoglobin: 9.4 g/dL — ABNORMAL LOW (ref 12.0–15.0)
MCH: 28.2 pg (ref 26.0–34.0)
MCHC: 32.4 g/dL (ref 30.0–36.0)
MCV: 87.1 fL (ref 80.0–100.0)
Platelets: 223 10*3/uL (ref 150–400)
RBC: 3.33 MIL/uL — ABNORMAL LOW (ref 3.87–5.11)
RDW: 13.2 % (ref 11.5–15.5)
WBC: 9.5 10*3/uL (ref 4.0–10.5)
nRBC: 0 % (ref 0.0–0.2)

## 2022-02-20 MED ORDER — AZITHROMYCIN 250 MG PO TABS: ORAL_TABLET | ORAL | 0 refills | Status: AC

## 2022-02-20 NOTE — Discharge Summary (Signed)
Physician Discharge Summary  Patient ID: [name] MRN: [id number] DOB/AGE: 07-Oct-1990 31 y.o.  Admit date: 02/17/2022 Discharge date: 02/20/2022  Admission Diagnoses: SOB, Nausea, Abdominal Pain  Discharge Diagnoses:  Principal Problem:   Post-op pain Active Problems:   Nausea   Pneumonia   Postoperative atelectasis   Postoperative anemia due to acute blood loss   Obese   Discharged Condition: good  Hospital Course: Admitted with pneumonia and found to have incisional hernia which was repaired surgically on HD#2.  POD#1 s/p operative laparoscopy, reduction of bowel, repair of hernia pt was passing flatus, had a BM, NABS, tolerating regular diet and ready for discharge.  Pt did well post op.  Consults: general surgery  Significant Diagnostic Studies: CBC and BMP, neg throat cx  Treatments: surgery as above in addition to antibiotics for pneumonia.  Discharge Exam: Blood pressure 99/67, pulse 77, temperature 98.2 F (36.8 C), temperature source Oral, resp. rate 16, height 6' 0.01" (1.829 m), weight (!) 150 kg, last menstrual period 02/07/2022, SpO2 96 %. General appearance: alert and no distress Resp: decreased at bases but improved Cardio: regular rate and rhythm GI: soft, appropriately tender, ND, no rebound, incision dressed appropriately with dermabond and pfannenstiel incision c/d/dressing intact. Extremities: no calf tenderness Incision/Wound: Honeycomb dressing in place and no staining  Disposition:  There are no questions and answers to display.         Allergies as of 02/20/2022       Reactions   Bactrim [sulfamethoxazole-trimethoprim] Other (See Comments)   blisters on mouth        Medication List     TAKE these medications    azithromycin 250 MG tablet Commonly known as: Zithromax Z-Pak Take 2 tablets by mouth the first day then 1 tablet daily for 4 days   cetirizine 10 MG tablet Commonly known as: ZYRTEC Take 10 mg by mouth  in the morning.   famotidine 20 MG tablet Commonly known as: Pepcid Take 1 tablet (20 mg total) by mouth 2 (two) times daily.   ibuprofen 600 MG tablet Commonly known as: ADVIL Take 1 tablet (600 mg total) by mouth every 6 (six) hours as needed for mild pain, moderate pain or cramping.   norethindrone 5 MG tablet Commonly known as: AYGESTIN Take 10 mg by mouth daily.   oxyCODONE-acetaminophen 5-325 MG tablet Commonly known as: Percocet Take 1 tablet by mouth every 4 (four) hours as needed for severe pain.        Follow-up Information     Everett Graff, MD. Go to.   Specialty: Obstetrics and Gynecology Why: Post op appointment in about 2wks it should already be scheduled but if not, please call office. Contact information: [place] [address] [phone]                 Signed: Delice Lesch 02/20/2022, 9:53 AM

## 2022-02-20 NOTE — Op Note (Signed)
Preop Diagnosis: Probable Hernia Repair   Postop Diagnosis: 1.DEHISCENCE OF SUPRAPUBIC FASCIAL INCISION 2.RIGHT LOWER QUADRANT INCISIONAL HERNIA   Procedure: 1.Operative Laparoscopy 2.Reduction of Small Bowel at Hernia Site 3.Hernia repair x 2  Anesthesia: General   Anesthesiologist: See flowsheet   Attending: Everett Graff, MD   Intraop Consult: Annye English, MD, [place], MD, General Surgery  Findings: Several loops of bowel in hernia.  Viable bowel noted.  Pathology: N/a  Fluids: See flowsheet  UOP: See flowsheet  EBL: 50 cc  Complications:  Procedure:  The patient was taken to the operating room after the risks, benefits, alternatives, complications, treatment options, and expected outcomes were discussed with the patient. The patient verbalized understanding, the patient concurred with the proposed plan and consent signed and witnessed. The patient was taken to the Operating Room and identified as [name] and the procedure verified as Dx Laparoscopy with Probable Hernia Repair.  The patient was placed under general anesthesia per anesthesia staff, the patient was placed in modified dorsal lithotomy position and was prepped, draped, and catheterized in the normal, sterile fashion.  A Time Out was held and the above information confirmed.  The 97LG umbilical incision was reopened, fascia tagged with 0 vicryl suture and Hassan placed.  Laparoscope introduced and findings as noted above.  A 49mm trocar was placed in the LLQ at the previous incision site and 79mm laparoscope introduced.  Dr. Dema Severin proceeded to reduce the herniated viable bowel.  The suprapubic skin incision was extended bilaterally and peritoneum repaired.  The suprapubic fascia was repaired with PDS via a running stitch from both sides of the incision and then tied in the middle.  An area in the RLQ was still visualized to have a defect and initially thought might need mesh.   The oncoming general surgeon, Dr. Robby Sermon, relieved Dr. Dema Severin after he identified the fascial defect which was likely from the 28mm RLQ port which had extended to about 3cm at the time of surgery from the herniated bowel.  The fascia was repaired with PDS.  Please see Dr. Robby Sermon op note for details.  Laparoscopy was reintroduced in the 65mm port and LLQ trocar removed.  Pneumoperitoneum was relieved and umbilical trocar removed.  The umbilical fascia was repaired with PDS.  The laparotomy incision was repaired with 3-0 monocryl as well as the 92JJ umbilical and LLQ incisions.  Dermabond was placed at the umbilical and LLQ incisions.  Steristrips with benzoin was placed on pfannenstiel incision along with a honeycomb dressing.    Sponge, instrument, lap and needle counts were correct.  The patient tolerated the procedure well and was awaiting extubation and transfer to the recovery room.

## 2022-02-20 NOTE — Progress Notes (Signed)
Patient ID: [name], female   DOB: [birth year], 31 y.o.   MRN: [id number] [facility] Progress Note:   1 Day Post-Op   THE PLAN  Tolerating clears-advance diet as tolerated Group A strep per anesthesia for tonsillitis was negative  Subjective: Mental status is clear.  Complaints none. Objective: Vital signs in last 24 hours: Temp:  [97.5 F (36.4 C)-98.8 F (37.1 C)] 98.2 F (36.8 C) (10/15 0743) Pulse Rate:  [76-102] 77 (10/15 0743) Resp:  [16-20] 16 (10/15 0743) BP: (99-117)/(59-91) 99/67 (10/15 0743) SpO2:  [90 %-100 %] 96 % (10/15 0743) Weight:  [150 kg] 150 kg (10/14 1721)  Intake/Output from previous day: 10/14 0701 - 10/15 0700 In: 2046 [I.V.:1346; IV Piggyback:700] Out: 400 [Urine:350; Blood:50] Intake/Output this shift: No intake/output data recorded.  Physical Exam: Work of breathing is not labored  Lab Results:  Results for orders placed or performed during the hospital encounter of 02/17/22 (from the past 48 hour(s))  CBC with Differential     Status: Abnormal   Collection Time: 02/18/22  1:23 PM  Result Value Ref Range   WBC 14.4 (H) 4.0 - 10.5 K/uL   RBC 3.83 (L) 3.87 - 5.11 MIL/uL   Hemoglobin 10.8 (L) 12.0 - 15.0 g/dL   HCT 37.1 (L) 69.6 - 78.9 %   MCV 84.3 80.0 - 100.0 fL   MCH 28.2 26.0 - 34.0 pg   MCHC 33.4 30.0 - 36.0 g/dL   RDW 38.1 01.7 - 51.0 %   Platelets 179 150 - 400 K/uL    Comment: REPEATED TO VERIFY   nRBC 0.0 0.0 - 0.2 %   Neutrophils Relative % 84 %   Neutro Abs 12.2 (H) 1.7 - 7.7 K/uL   Lymphocytes Relative 9 %   Lymphs Abs 1.2 0.7 - 4.0 K/uL   Monocytes Relative 6 %   Monocytes Absolute 0.8 0.1 - 1.0 K/uL   Eosinophils Relative 0 %   Eosinophils Absolute 0.0 0.0 - 0.5 K/uL   Basophils Relative 0 %   Basophils Absolute 0.0 0.0 - 0.1 K/uL   Immature Granulocytes 1 %   Abs Immature Granulocytes 0.16 (H) 0.00 - 0.07 K/uL    Comment: Performed at [facility], [address], [place], [place]  [address]  Basic metabolic panel     Status: Abnormal   Collection Time: 02/19/22 10:39 AM  Result Value Ref Range   Sodium 139 135 - 145 mmol/L   Potassium 3.6 3.5 - 5.1 mmol/L   Chloride 111 98 - 111 mmol/L   CO2 18 (L) 22 - 32 mmol/L   Glucose, Bld 109 (H) 70 - 99 mg/dL    Comment: Glucose reference range applies only to samples taken after fasting for at least 8 hours.   BUN 15 6 - 20 mg/dL   Creatinine, Ser 7.78 0.44 - 1.00 mg/dL   Calcium 8.5 (L) 8.9 - 10.3 mg/dL   GFR, Estimated >24 >23 mL/min    Comment: (NOTE) Calculated using the CKD-EPI Creatinine Equation (2021)    Anion gap 10 5 - 15    Comment: Performed at [facility], [address] [place], [place] [address]  CBC with Differential/Platelet     Status: Abnormal   Collection Time: 02/19/22  1:56 PM  Result Value Ref Range   WBC 10.5 4.0 - 10.5 K/uL   RBC 3.60 (L) 3.87 - 5.11 MIL/uL   Hemoglobin 10.1 (L) 12.0 - 15.0 g/dL   HCT  30.6 (L) 36.0 - 46.0 %   MCV 85.0 80.0 - 100.0 fL   MCH 28.1 26.0 - 34.0 pg   MCHC 33.0 30.0 - 36.0 g/dL   RDW 15.1 76.1 - 60.7 %   Platelets 220 150 - 400 K/uL   nRBC 0.0 0.0 - 0.2 %   Neutrophils Relative % 77 %   Neutro Abs 8.1 (H) 1.7 - 7.7 K/uL   Lymphocytes Relative 16 %   Lymphs Abs 1.7 0.7 - 4.0 K/uL   Monocytes Relative 5 %   Monocytes Absolute 0.5 0.1 - 1.0 K/uL   Eosinophils Relative 1 %   Eosinophils Absolute 0.1 0.0 - 0.5 K/uL   Basophils Relative 0 %   Basophils Absolute 0.0 0.0 - 0.1 K/uL   Immature Granulocytes 1 %   Abs Immature Granulocytes 0.07 0.00 - 0.07 K/uL    Comment: Performed at [facility], [address], [place], [place] [address]  Group A Strep by PCR     Status: None   Collection Time: 02/19/22  5:59 PM   Specimen: PATH Other; ENT  Result Value Ref Range   Group A Strep by PCR NOT DETECTED NOT DETECTED    Comment: Performed at [facility], [address] [place], [place] [address]  CBC     Status: Abnormal   Collection Time:  02/20/22  5:52 AM  Result Value Ref Range   WBC 9.5 4.0 - 10.5 K/uL   RBC 3.33 (L) 3.87 - 5.11 MIL/uL   Hemoglobin 9.4 (L) 12.0 - 15.0 g/dL   HCT 54.6 (L) 27.0 - 35.0 %   MCV 87.1 80.0 - 100.0 fL   MCH 28.2 26.0 - 34.0 pg   MCHC 32.4 30.0 - 36.0 g/dL   RDW 09.3 81.8 - 29.9 %   Platelets 223 150 - 400 K/uL   nRBC 0.0 0.0 - 0.2 %    Comment: Performed at [facility], [address] [place], [place] [address]  Basic metabolic panel     Status: Abnormal   Collection Time: 02/20/22  5:52 AM  Result Value Ref Range   Sodium 139 135 - 145 mmol/L   Potassium 4.1 3.5 - 5.1 mmol/L   Chloride 111 98 - 111 mmol/L   CO2 21 (L) 22 - 32 mmol/L   Glucose, Bld 101 (H) 70 - 99 mg/dL    Comment: Glucose reference range applies only to samples taken after fasting for at least 8 hours.   BUN 12 6 - 20 mg/dL   Creatinine, Ser 6.78 0.44 - 1.00 mg/dL   Calcium 8.0 (L) 8.9 - 10.3 mg/dL   GFR, Estimated >93 >81 mL/min    Comment: (NOTE) Calculated using the CKD-EPI Creatinine Equation (2021)    Anion gap 7 5 - 15    Comment: Performed at [facility], [address] [place], [place] [address]    Radiology/Results: DG Abd 1 View  Result Date: 02/19/2022 CLINICAL DATA:  Abdominal pain, vomiting EXAM: ABDOMEN - 1 VIEW COMPARISON:  None FINDINGS: Examination is technically difficult due to patient's body habitus. There is moderate dilation of proximal small bowel loops measuring up to 4.5 cm. Stomach is not distended. Gas is present in colon. Degenerative changes are noted in lumbar spine. IMPRESSION: There is moderate dilation of small-bowel loops suggesting ileus or partial small bowel obstruction. Electronically Signed   By: Ernie Avena M.D.   On: 02/19/2022 14:51   US PELVIS (  TRANSABDOMINAL ONLY)  Result Date: 02/18/2022 CLINICAL DATA:  Free fluid in pelvis EXAM: TRANSABDOMINAL ULTRASOUND OF PELVIS TECHNIQUE: Transabdominal ultrasound examination of the pelvis was performed  including evaluation of the uterus, ovaries, adnexal regions, and pelvic cul-de-sac. COMPARISON:  CT earlier today. FINDINGS: Uterus Measurements: Uterus not visualized Endometrium Thickness: Not visualized. Right ovary Measurements: Ovary not visualized. Left ovary Measurements: Ovary not visualized. Other findings:  Free fluid noted in the pelvis as seen on CT. IMPRESSION: Limited exam due to body habitus and patient condition. Uterus nor ovaries visualized. Small amount of free fluid in the pelvis. Electronically Signed   By: Rolm Baptise M.D.   On: 02/18/2022 11:28    Anti-infectives: Anti-infectives (From admission, onward)    Start     Dose/Rate Route Frequency Ordered Stop   02/19/22 0200  cefTRIAXone (ROCEPHIN) 2 g in sodium chloride 0.9 % 100 mL IVPB        2 g 200 mL/hr over 30 Minutes Intravenous Every 24 hours 02/18/22 1318     02/18/22 0215  cefTRIAXone (ROCEPHIN) 2 g in sodium chloride 0.9 % 100 mL IVPB        2 g 200 mL/hr over 30 Minutes Intravenous  Once 02/18/22 0205 02/18/22 0327   02/18/22 0215  azithromycin (ZITHROMAX) 500 mg in sodium chloride 0.9 % 250 mL IVPB        500 mg 250 mL/hr over 60 Minutes Intravenous Every 24 hours 02/18/22 0205         Assessment/Plan: Problem List: Patient Active Problem List   Diagnosis Date Noted   Post-op pain 02/18/2022   Nausea 02/18/2022   Pneumonia 02/18/2022   Postoperative atelectasis 02/18/2022   Postoperative anemia due to acute blood loss 02/18/2022   Obese 02/18/2022   Endometrioma of ovary 08/11/2020   Missed periods 08/03/2020   Vaginal discharge 08/03/2020   History of ovarian cyst 08/03/2020   Pelvic pain 08/03/2020   Pregnancy examination or test, negative result 08/03/2020   Dyspareunia, female 08/03/2020   Dysmenorrhea 08/03/2020   Rectal bleeding 01/11/2011    Had a BM.  Ok to advance diet as tolerated.   1 Day Post-Op    LOS: 2 days   Matt B. Hassell Done, MD, [facility] [phone] to reach the surgeon on call.    02/20/2022 9:17 AM

## 2022-02-20 NOTE — Progress Notes (Signed)
  Transition of Care [facility]) Screening Note   Patient Details  Name: [name] Date of Birth: 1991/04/30   Transition of Care [facility]) CM/SW Contact:    Bartholomew Crews, RN Phone Number: [phone] 02/20/2022, 1:37 PM    Transition of Care Department [facility]) has reviewed patient and no TOC needs have been identified at this time. We will continue to monitor patient advancement through interdisciplinary progression rounds. If new patient transition needs arise, please place a TOC consult.

## 2022-02-21 ENCOUNTER — Telehealth: Payer: Self-pay

## 2022-02-21 ENCOUNTER — Encounter (HOSPITAL_COMMUNITY): Payer: Self-pay | Admitting: Obstetrics and Gynecology

## 2022-02-21 NOTE — Anesthesia Postprocedure Evaluation (Signed)
Anesthesia Post Note  Patient: FREDRICKA KOHRS  Procedure(s) Performed: LAPAROSCOPY OPERATIVE EXPLORATORY LAPAROTOMY HERNIA REPAIR INCISIONAL X2     Patient location during evaluation: PACU Anesthesia Type: General Level of consciousness: awake and alert Pain management: pain level controlled Vital Signs Assessment: post-procedure vital signs reviewed and stable Respiratory status: spontaneous breathing, nonlabored ventilation, respiratory function stable and patient connected to nasal cannula oxygen Cardiovascular status: blood pressure returned to baseline and stable Postop Assessment: no apparent nausea or vomiting Anesthetic complications: no   No notable events documented.  Last Vitals:  Vitals:   02/20/22 0743 02/20/22 1152  BP: 99/67 118/73  Pulse: 77 68  Resp: 16 17  Temp: 36.8 C 36.8 C  SpO2: 96% 99%    Last Pain:  Vitals:   02/20/22 1152  TempSrc: Oral  PainSc:                  March Rummage Nikholas Geffre

## 2022-02-21 NOTE — Telephone Encounter (Signed)
Transition Care Management Follow-up Telephone Call Date of discharge and from where: 02/20/2022 [name]  How have you been since you were released from the hospital? Patient states that she is doing well - she has increase gas Any questions or concerns? Yes - patient ask about the gas that she is having - explained that it is normal after procedure and to call and follow up with our office or surgical office if no improvement or if worsens   Items Reviewed: Did the pt receive and understand the discharge instructions provided? Yes  Medications obtained and verified? Yes  Other? Yes  Any new allergies since your discharge? Yes  Dietary orders reviewed? Yes Do you have support at home? Yes   Home Care and Equipment/Supplies: Were home health services ordered? no If so, what is the name of the agency? NA  Has the agency set up a time to come to the patient's home? no Were any new equipment or medical supplies ordered?  No What is the name of the medical supply agency? NA Were you able to get the supplies/equipment? not applicable Do you have any questions related to the use of the equipment or supplies? No  Functional Questionnaire: (I = Independent and D = Dependent) ADLs: I  Bathing/Dressing- I  Meal Prep- I  Eating- I  Maintaining continence- I  Transferring/Ambulation- I  Managing Meds- I  Follow up appointments reviewed:  PCP Hospital f/u appt confirmed?  Patient declined - she is following up with GYN and will call out office if needed   [facility] f/u appt confirmed? Yes  Scheduled to see GYN  on 03/02/2022  Are transportation arrangements needed? Yes  If their condition worsens, is the pt aware to call PCP or go to the Emergency Dept.? Yes Was the patient provided with contact information for the PCP's office or ED? Yes Was to pt encouraged to call back with questions or concerns? Yes

## 2022-04-28 ENCOUNTER — Other Ambulatory Visit: Payer: Self-pay | Admitting: Nurse Practitioner

## 2022-04-28 DIAGNOSIS — K219 Gastro-esophageal reflux disease without esophagitis: Secondary | ICD-10-CM

## 2022-07-20 ENCOUNTER — Other Ambulatory Visit: Payer: Self-pay | Admitting: *Deleted

## 2022-07-20 DIAGNOSIS — K219 Gastro-esophageal reflux disease without esophagitis: Secondary | ICD-10-CM

## 2022-07-20 MED ORDER — FAMOTIDINE 20 MG PO TABS: 20.0000 mg | ORAL_TABLET | Freq: Two times a day (BID) | ORAL | 0 refills | Status: AC

## 2022-10-07 IMAGING — MR MR PELVIS WO/W CM
19 of 20 series · 44 of 48 positions shown · IV contrast (multihance)
Comparison: Nd 08/10/2020.  Pelvis ultrasound 02/20/2020

CLINICAL DATA: Cystic adnexal masses on ultrasound.

EXAM:
MRI PELVIS WITHOUT AND WITH CONTRAST
TECHNIQUE: Multiplanar multisequence MR imaging of the pelvis was performed
both before and after administration of intravenous contrast.
CONTRAST:  20mL MULTIHANCE GADOBENATE DIMEGLUMINE 529 MG/ML IV SOLN

[Series 2: T2 · coronal · 5.0mm · 1.25mm/px · 1 of 36 slices shown (1 of 4)]
[im 1/36]
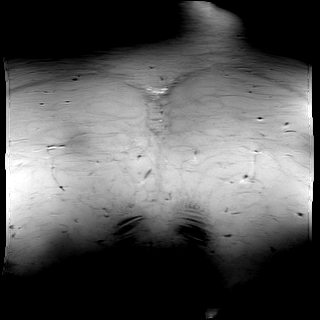

[Series 3: T2 · axial · 5.0mm · 0.41mm/px · 1 of 37 slices shown (2 of 4)]
[im 1/37]
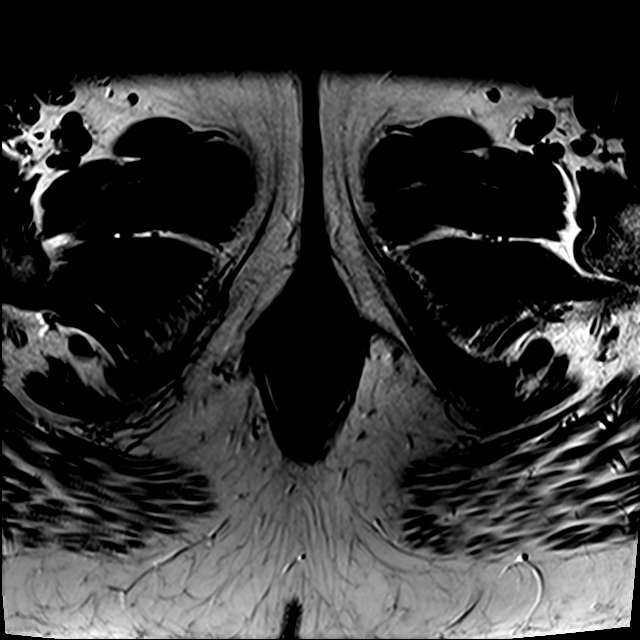

[Series 4: T2 fat-sat · axial · 5.0mm · 0.41mm/px · 1 of 37 slices shown]
[im 1/37]
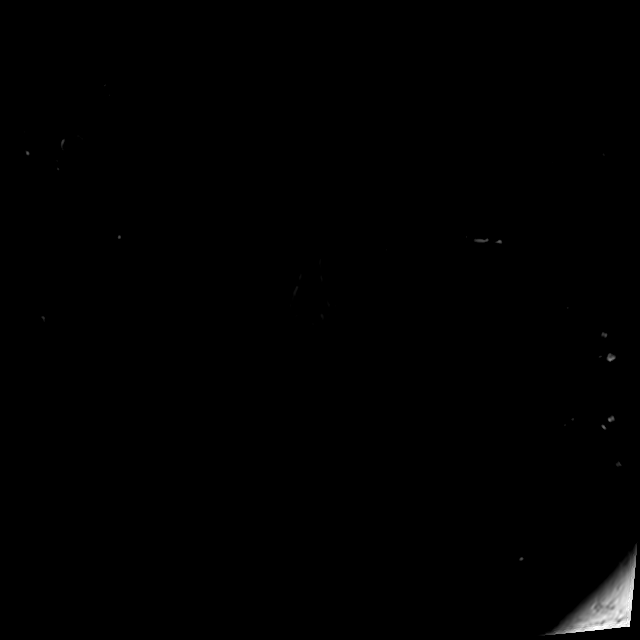

[Series 5: T2 · sagittal · 5.0mm · 0.78mm/px · 1 of 31 slices shown (3 of 4)]
[im 1/31]
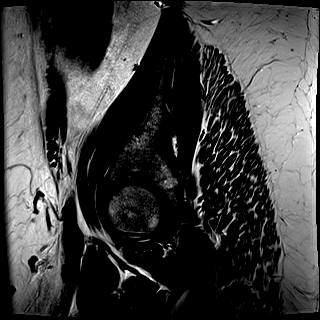

[Series 6: T2 · coronal · 5.0mm · 0.81mm/px · 1 of 33 slices shown (4 of 4)]
[im 1/33]
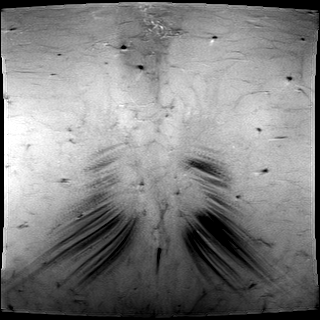

[Series 7: ax dwi_tracew_dfc · axial · 5.0mm · 1.19mm/px · z∈[-139,+71]mm · 3 of 108 slices shown]
[im 1/108]
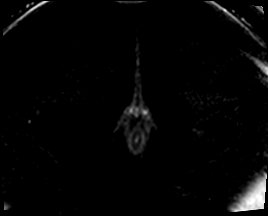
[im 54/108]
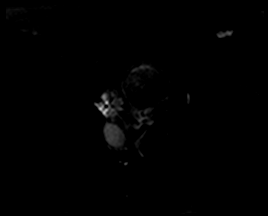
[im 108/108]
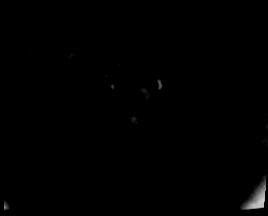

[Series 8: ax dwi_adc_dfc · axial · 5.0mm · 1.19mm/px · 1 of 36 slices shown]
[im 1/36]
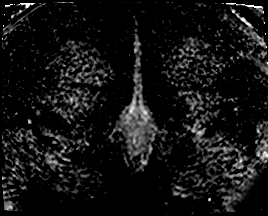

[Series 9: T1 · axial · 5.0mm · 1.25mm/px · z∈[-147,+88]mm · 2 of 96 slices shown]
[im 1/96]
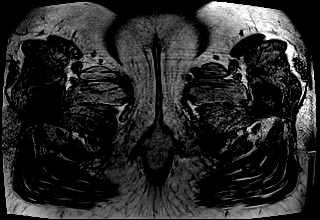
[im 96/96]
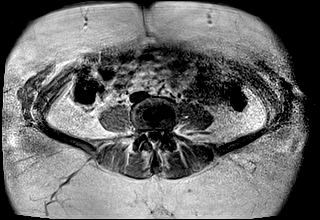

[Series 10: T1 dynamic · axial · non-contrast · 3.0mm · 1.25mm/px · z∈[-148,+89]mm · 2 of 80 slices shown]
[im 1/80]
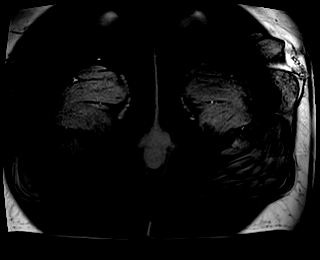
[im 80/80]
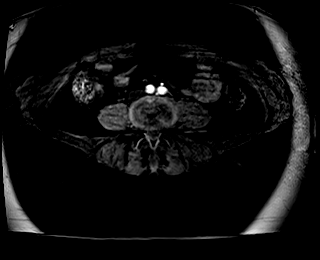

[Series 11: T1 dynamic post-contrast · axial · 3.0mm · 1.25mm/px · z∈[-148,+89]mm · 3 of 80 slices shown (1 of 8)]
[im 1/80]
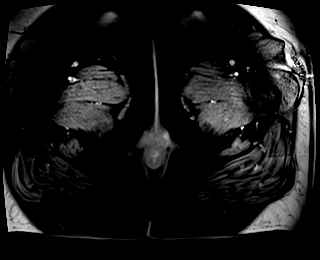
[im 40/80]
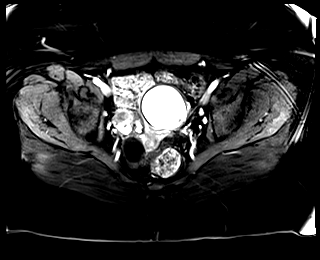
[im 80/80]
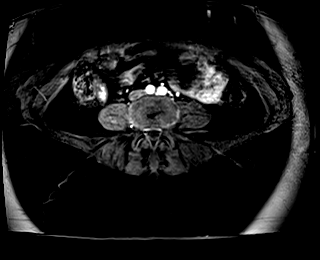

[Series 12: T1 dynamic post-contrast · axial · 3.0mm · 1.25mm/px · z∈[-148,+89]mm · 3 of 80 slices shown (2 of 8)]
[im 1/80]
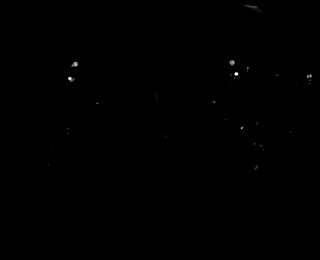
[im 40/80]
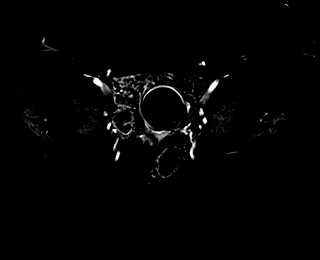
[im 80/80]
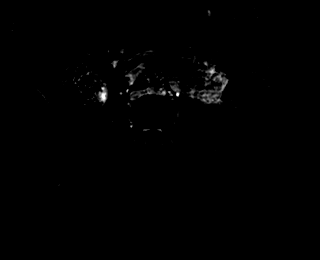

[Series 13: T1 dynamic post-contrast · axial · 3.0mm · 1.25mm/px · z∈[-148,+89]mm · 3 of 80 slices shown (3 of 8)]
[im 1/80]
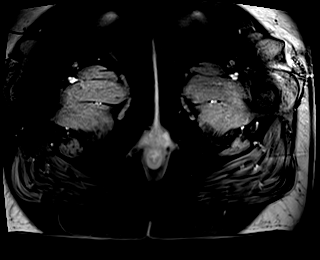
[im 40/80]
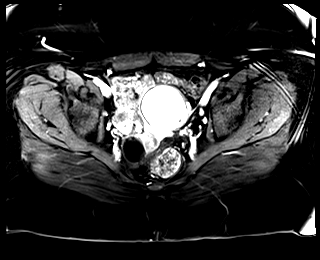
[im 80/80]
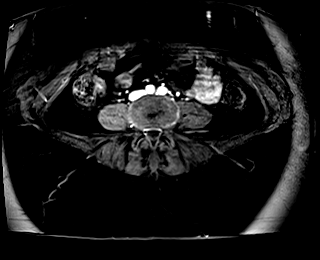

[Series 14: T1 dynamic post-contrast · axial · 3.0mm · 1.25mm/px · z∈[-148,+89]mm · 3 of 80 slices shown (4 of 8)]
[im 1/80]
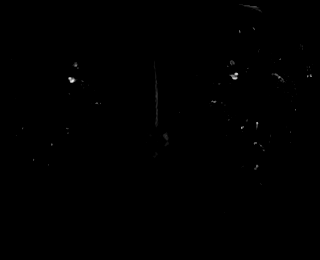
[im 40/80]
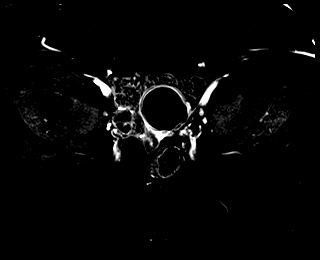
[im 80/80]
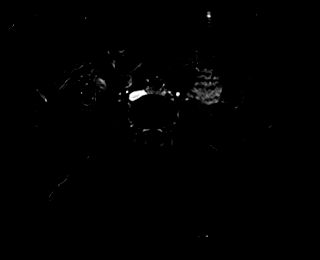

[Series 15: T1 dynamic post-contrast · axial · 3.0mm · 1.25mm/px · z∈[-148,+89]mm · 3 of 80 slices shown (5 of 8)]
[im 1/80]
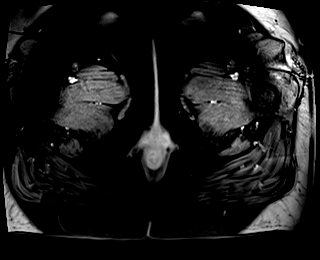
[im 40/80]
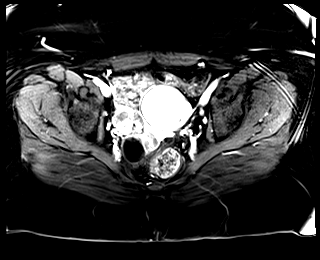
[im 80/80]
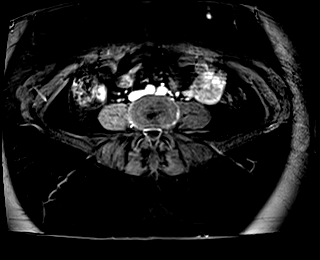

[Series 16: T1 dynamic post-contrast · axial · 3.0mm · 1.25mm/px · z∈[-148,+89]mm · 3 of 80 slices shown (6 of 8)]
[im 1/80]
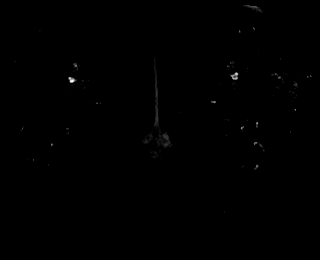
[im 40/80]
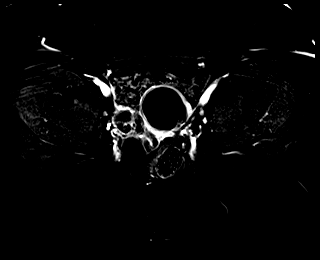
[im 80/80]
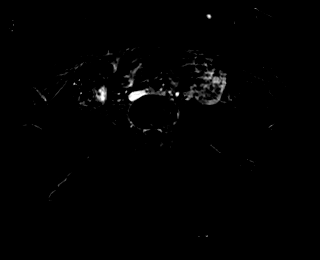

[Series 17: T1 dynamic post-contrast · axial · 3.0mm · 1.25mm/px · z∈[-148,+89]mm · 3 of 80 slices shown (7 of 8)]
[im 1/80]
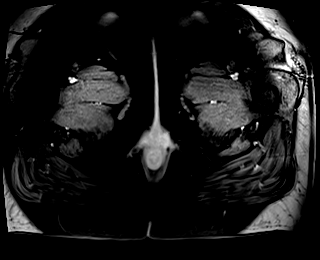
[im 40/80]
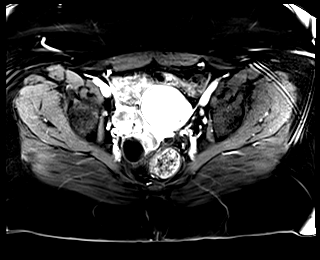
[im 80/80]
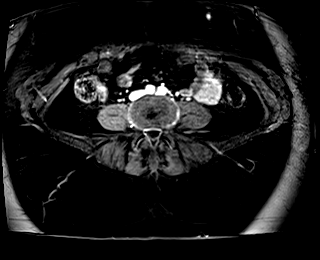

[Series 18: T1 dynamic post-contrast · axial · 3.0mm · 1.25mm/px · z∈[-148,+89]mm · 3 of 80 slices shown (8 of 8)]
[im 1/80]
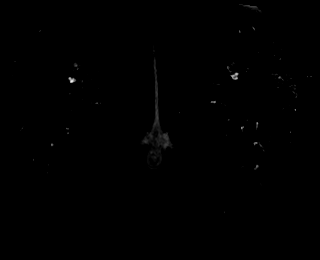
[im 40/80]
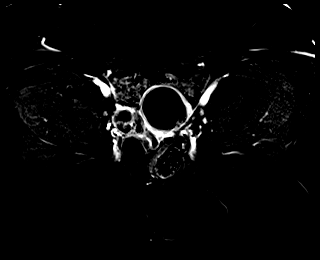
[im 80/80]
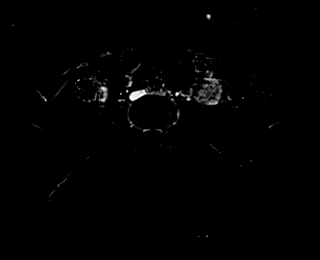

[Series 19: T1 fat-sat post-contrast · axial · 1.2mm · 1.09mm/px · z∈[-136,+55]mm · 5 of 160 slices shown]
[im 1/160]
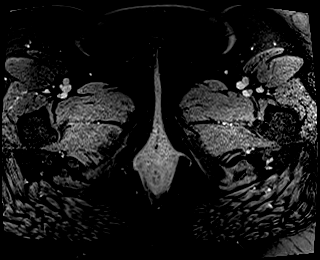
[im 40/160]
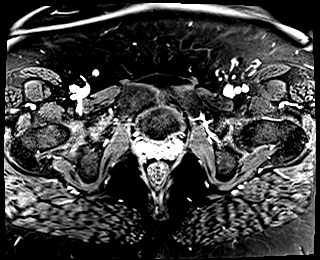
[im 80/160]
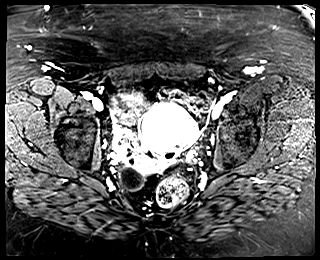
[im 120/160]
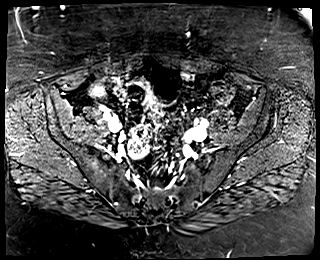
[im 160/160]
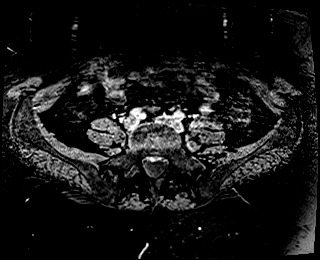

[Series 20: T1 fat-sat · sagittal · 1.2mm · 0.88mm/px · 2 of 144 slices shown]
[im 1/144]
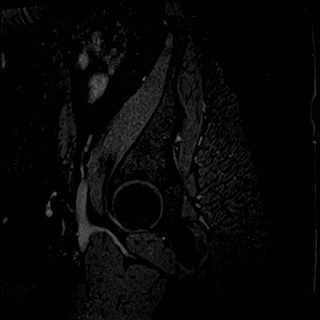
[im 36/144]
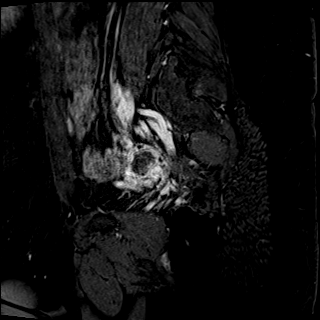

[44 of 48 positions shown; findings below may reference images not displayed]

FINDINGS: Urinary Tract: Unremarkable urinary bladder.

Bowel: Unremarkable pelvic bowel loops.

Vascular/Lymphatic: No pathologically enlarged lymph nodes or other
significant abnormality.

Reproductive:

Uterus: Measures 2.4 x 5.7 x 4.7 cm. No evidence for fibroids.
Endometrium is thin and homogeneous. Junctional zone is well
preserved throughout.

Right ovary: 4.0 x 3.4 x 4.1 cm. 2.2 cm dominant follicle. 1.8 cm
lesion in the posterior ovarian parenchyma (axial T1 image 40/10)
shows intermediate to high signal intensity with shading on T2
imaging, suggestive of endometrioma. A second probable focus of
endometriosis in the medial aspect of the posterior left ovary
measures 14 mm (image 40/10). A third probable endometrioma in the
right ovary measures 2.2 cm on 38/10.

Serpentine fluid-filled structure posterior to the right ovary, in
the right aspect of the cul-de-sac probably represents a dilated
fallopian tube measuring up to about 6 mm diameter (images 19 and 20
of series [DATE] cm simple appearing exophytic or paraovarian cyst
is seen just posterior to the right ovarian parenchyma. There is a
trace amount of fluid in the right adnexal space.

Left ovary: Left ovary measures approximately 6.7 x 6.4 x 6.8 cm
within the left ovary is a 5.4 x 5.5 x 5.7 cm lesion showing
homogeneous increased signal intensity on T1 imaging with T2 shading
on axial T2 sequence (see image 18 of series 4). This appears to
have 2 additional cystic components extending posteriorly with
similar imaging characteristics, one measuring 3.5 cm and the other
measuring approximately 2.4 cm (axial T1 image 36 of series 10).
Overall imaging features of this left ovarian lesion are compatible
with dominant 5.7 cm endometrioma having to immediately adjacent
versus contiguous exophytic lesions immediately posterior to the
dominant finding.

Other: Small cysts are identified in the cul-de-sac measuring 9 mm
and 12 mm (axial T2 images 19 and 21 of series 4). Both of these
show low signal intensity on T1 imaging with high signal intensity
on T2 imaging, features not consistent with endometriomas.

Musculoskeletal: No focal suspicious marrow enhancement within the
visualized bony anatomy.
IMPRESSION: 1. Multiple bilateral ovarian lesions measuring up to 5.7 cm in
maximum diameter, most consistent with endometriomas. No findings
for T1 shortening along the peritoneal surface or in the cul-de-sac
to suggest peritoneal endometriosis.
2. Serpentine fluid-filled structure in the right cul-de-sac is felt
to be a dilated Fallopian tube.
3. 3.2 cm simple appearing exophytic or paraovarian cyst seen just
posterior to the right ovarian parenchyma. This has simple features
without wall thickening, internal architecture, or papillary
projection
4. Small cysts in the cul-de-sac measuring 9 mm and 12 mm.,
indeterminate.
